# Patient Record
Sex: Male | Born: 1935 | Race: White | Hispanic: No | Smoking: Former smoker
Health system: Southern US, Community
[De-identification: ages and names within clinical notes are randomized; demographics above are authoritative.]

## PROBLEM LIST (undated history)

## (undated) DIAGNOSIS — I10 Essential (primary) hypertension: Secondary | ICD-10-CM

## (undated) DIAGNOSIS — E119 Type 2 diabetes mellitus without complications: Secondary | ICD-10-CM

## (undated) DIAGNOSIS — I2510 Atherosclerotic heart disease of native coronary artery without angina pectoris: Secondary | ICD-10-CM

---

## 1989-06-11 HISTORY — PX: CORONARY ARTERY BYPASS GRAFT: SHX141

## 2000-04-15 ENCOUNTER — Encounter: Payer: Self-pay | Admitting: *Deleted

## 2000-04-15 ENCOUNTER — Inpatient Hospital Stay (HOSPITAL_COMMUNITY): Admission: EM | Admit: 2000-04-15 | Discharge: 2000-04-21 | Payer: Self-pay | Admitting: Podiatry

## 2000-04-25 ENCOUNTER — Encounter: Admission: RE | Admit: 2000-04-25 | Discharge: 2000-07-24 | Payer: Self-pay | Admitting: Cardiology

## 2006-01-03 ENCOUNTER — Inpatient Hospital Stay (HOSPITAL_COMMUNITY): Admission: RE | Admit: 2006-01-03 | Discharge: 2006-01-04 | Payer: Self-pay | Admitting: Cardiology

## 2006-11-25 ENCOUNTER — Ambulatory Visit: Payer: Self-pay | Admitting: Internal Medicine

## 2007-10-25 DIAGNOSIS — I10 Essential (primary) hypertension: Secondary | ICD-10-CM | POA: Insufficient documentation

## 2007-10-25 DIAGNOSIS — E785 Hyperlipidemia, unspecified: Secondary | ICD-10-CM

## 2007-10-25 DIAGNOSIS — I2510 Atherosclerotic heart disease of native coronary artery without angina pectoris: Secondary | ICD-10-CM

## 2007-10-25 DIAGNOSIS — I219 Acute myocardial infarction, unspecified: Secondary | ICD-10-CM | POA: Insufficient documentation

## 2007-10-25 DIAGNOSIS — E1159 Type 2 diabetes mellitus with other circulatory complications: Secondary | ICD-10-CM

## 2009-12-13 ENCOUNTER — Inpatient Hospital Stay (HOSPITAL_COMMUNITY): Admission: RE | Admit: 2009-12-13 | Discharge: 2009-12-17 | Payer: Self-pay | Admitting: Cardiovascular Disease

## 2010-08-27 LAB — BASIC METABOLIC PANEL
BUN: 13 mg/dL (ref 6–23)
BUN: 13 mg/dL (ref 6–23)
BUN: 9 mg/dL (ref 6–23)
CO2: 27 mEq/L (ref 19–32)
Calcium: 8.7 mg/dL (ref 8.4–10.5)
Chloride: 100 mEq/L (ref 96–112)
Chloride: 107 mEq/L (ref 96–112)
Creatinine, Ser: 1.46 mg/dL (ref 0.4–1.5)
GFR calc Af Amer: 57 mL/min — ABNORMAL LOW (ref >60)
GFR calc non Af Amer: 47 mL/min — ABNORMAL LOW (ref >60)
GFR calc non Af Amer: 47 mL/min — ABNORMAL LOW (ref >60)
GFR calc non Af Amer: 57 mL/min — ABNORMAL LOW (ref >60)
Glucose, Bld: 175 mg/dL — ABNORMAL HIGH (ref 70–99)
Glucose, Bld: 213 mg/dL — ABNORMAL HIGH (ref 70–99)
Glucose, Bld: 328 mg/dL — ABNORMAL HIGH (ref 70–99)
Potassium: 4.4 mEq/L (ref 3.5–5.1)
Potassium: 4.6 mEq/L (ref 3.5–5.1)
Sodium: 134 mEq/L — ABNORMAL LOW (ref 135–145)

## 2010-08-27 LAB — LIPID PANEL
LDL Cholesterol: 38 mg/dL (ref 0–99)
VLDL: 35 mg/dL (ref 0–40)

## 2010-08-27 LAB — CBC
HCT: 30.7 % — ABNORMAL LOW (ref 39.0–52.0)
HCT: 34.5 % — ABNORMAL LOW (ref 39.0–52.0)
Hemoglobin: 10.6 g/dL — ABNORMAL LOW (ref 13.0–17.0)
Hemoglobin: 11.6 g/dL — ABNORMAL LOW (ref 13.0–17.0)
Hemoglobin: 9.6 g/dL — ABNORMAL LOW (ref 13.0–17.0)
MCHC: 34.1 g/dL (ref 30.0–36.0)
MCHC: 34.2 g/dL (ref 30.0–36.0)
MCHC: 35 g/dL (ref 30.0–36.0)
MCHC: 35.3 g/dL (ref 30.0–36.0)
MCV: 99.8 fL (ref 78.0–100.0)
Platelets: 98 10*3/uL — ABNORMAL LOW (ref 150–400)
Platelets: 99 10*3/uL — ABNORMAL LOW (ref 150–400)
RBC: 2.8 MIL/uL — ABNORMAL LOW (ref 4.22–5.81)
RBC: 3.06 MIL/uL — ABNORMAL LOW (ref 4.22–5.81)
RBC: 3.4 MIL/uL — ABNORMAL LOW (ref 4.22–5.81)
RDW: 12.7 % (ref 11.5–15.5)
RDW: 12.7 % (ref 11.5–15.5)
WBC: 5.1 10*3/uL (ref 4.0–10.5)
WBC: 5.8 10*3/uL (ref 4.0–10.5)

## 2010-08-27 LAB — GLUCOSE, CAPILLARY
Glucose-Capillary: 148 mg/dL — ABNORMAL HIGH (ref 70–99)
Glucose-Capillary: 183 mg/dL — ABNORMAL HIGH (ref 70–99)
Glucose-Capillary: 195 mg/dL — ABNORMAL HIGH (ref 70–99)
Glucose-Capillary: 197 mg/dL — ABNORMAL HIGH (ref 70–99)
Glucose-Capillary: 210 mg/dL — ABNORMAL HIGH (ref 70–99)
Glucose-Capillary: 211 mg/dL — ABNORMAL HIGH (ref 70–99)
Glucose-Capillary: 216 mg/dL — ABNORMAL HIGH (ref 70–99)
Glucose-Capillary: 246 mg/dL — ABNORMAL HIGH (ref 70–99)
Glucose-Capillary: 253 mg/dL — ABNORMAL HIGH (ref 70–99)
Glucose-Capillary: 324 mg/dL — ABNORMAL HIGH (ref 70–99)
Glucose-Capillary: 326 mg/dL — ABNORMAL HIGH (ref 70–99)

## 2010-08-27 LAB — MRSA PCR SCREENING: MRSA by PCR: NEGATIVE

## 2010-08-27 LAB — CARDIAC PANEL(CRET KIN+CKTOT+MB+TROPI): CK, MB: 2.2 ng/mL (ref 0.3–4.0)

## 2010-08-27 LAB — HEMOGLOBIN A1C
Hgb A1c MFr Bld: 7.6 % — ABNORMAL HIGH (ref <5.7)
Mean Plasma Glucose: 171 mg/dL — ABNORMAL HIGH (ref <117)

## 2010-08-27 LAB — TROPONIN I: Troponin I: 0.16 ng/mL — ABNORMAL HIGH (ref 0.00–0.06)

## 2010-10-24 NOTE — Assessment & Plan Note (Signed)
[facility]                         GASTROENTEROLOGY OFFICE NOTE   NAME:[name], [name]                         MRN:          [id number]  DATE:11/25/2006                            DOB:          Dec 27, 1934    REASON FOR CONSULTATION:  Screening colonoscopy in a patient on Plavix.   HISTORY OF PRESENT ILLNESS:  This is a 76 year old, white male with a  history of hypertension, diabetes mellitus, hyperlipidemia and coronary  artery disease, status post myocardial infarction.  He has also had  remote bypass surgery and has required angioplasty, PTC and multiple  coronary artery stent placements.  He has been on Plavix long-term post  stent placement.  From a cardiac standpoint, he has been stable.  It was  recently recommended by Dr. Clelia Croft that he undergo screening colonoscopy.  The patient had screening sigmoidoscopy years ago and this was  apparently unremarkable.  Currently, he denies any GI symptoms with no  nausea, vomiting, abdominal pain, change in bowel habits, melena or  hematochezia.  His appetite and weight are stable.   PAST MEDICAL HISTORY:  As above.   ALLERGIES:  No known drug allergies.   CURRENT MEDICATIONS:  1. Plavix 75 mg daily.  2. Norvasc 5 mg daily.  3. Isosorbide 30 mg daily.  4. Glipizide 5 mg daily.  5. Toprol XL 50 mg b.i.d.  6. Ramipril 5 mg b.i.d.  7. Lipitor 20 mg at night.  8. Aspirin 325 mg daily.  9. Tricor 145 mg at night.  10.Fish oil 1000 mg b.i.d.  11.Multivitamin daily.   FAMILY HISTORY:  No family history of gastrointestinal malignancy.   SOCIAL HISTORY:  The patient is divorced with three sons.  He lives  alone.  He is retired.  He does not smoke.  He has alcoholic beverage  twice per day.   REVIEW OF SYSTEMS:  Per diagnostic evaluation form.   PHYSICAL EXAMINATION:  GENERAL:  Well-appearing male in no acute  distress.  VITAL SIGNS:  Blood pressure 140/84, heart rate 100 and regular, weight  195  pounds, height 5 feet 8 inches in height.  HEENT:  Sclerae anicteric.  Conjunctivae pink.  Oral mucosa intact.  There is no adenopathy.  LUNGS:  Clear.  ABDOMEN:  Soft without tenderness, mass or hernia.  Good bowel sounds  heard.   IMPRESSION/PLAN:  This is a 76 year old, white male with a history of  coronary artery disease, currently on Plavix for history of coronary  artery stent placement.  He is also diabetic on oral agents.  He is  referred regarding screening colonoscopy.  He is asymptomatic.  He is an  appropriate candidate without contraindication.  However, his issues  with Plavix and diabetes place him at higher than baseline risk.  We  discussed today the pros and cons of continuing or discontinuing Plavix  therapy.  We will contact the patient's cardiologist, Dr. Elsie Lincoln, to see  if it is acceptable to come off of his Plavix for about 5-7 days prior  to the exam.  If this is acceptable, fine.  If not, the patient  understands limitations with regards to polypectomy for large lesions.  With regards to his diabetes, we will hold his oral agent the day of his  exam.     Wilhemina Bonito. Marina Goodell, MD  Electronically Signed    JNP/MedQ  DD: 11/25/2006  DT: 11/26/2006  Job #: [id number]   cc:   Madaline Savage, M.D.  Kari Baars, M.D.

## 2010-10-27 NOTE — Cardiovascular Report (Signed)
NAMEDUGLAS, HEIER NO.:  [id number]   MEDICAL RECORD NO.:  [id number]          PATIENT TYPE:  INP   LOCATION:  2807                         FACILITY:  [facility]   PHYSICIAN:  Madaline Savage, M.D.DATE OF BIRTH:  26-Feb-1935   DATE OF PROCEDURE:  01/03/2006  DATE OF DISCHARGE:                              CARDIAC CATHETERIZATION   PROCEDURES PERFORMED:  1.  Selective right coronary angiography by Judkins technique.  2.  Percutaneous coronary artery stenting of three lesions in the right      coronary artery.      1.  Proximal portion of posterolateral branch of right coronary artery      2.  Intracoronary artery stenting of the distal-most area in the distal          right coronary artery.  3.  Direct coronary artery stenting of a lesser distal right coronary      artery.  Three stents were overlapped which included a 12, a 15 and a 18      mm stent.   COMPLICATIONS:  None.   ENTRY SITE:  Right femoral.   DYE USED:  Omnipaque.   PATIENT PROFILE:  The patient is a delightful 76 year old white married  gentleman who has previously had coronary bypass grafting and has known  graft disease with closure of proximal right coronary artery graft which was  stented some years after his original bypass surgery.   Recently, the patient presented to my office complaining of anginal chest  pain.  A Cardiolite stress test was performed.  It showed RCA territory  reversible ischemia.   The patient subsequently underwent outpatient cardiac catheterization at [facility] and was noted to have patent grafts except for an  occluded graft to the right to the right coronary artery.  The native right  coronary artery was severely disease with to previously placed stents along  the course this vessel (proximal and mid).  There was new disease in the  distal RCA and in the junction between distal RCA and posterolateral branch.  This posterolateral branch was  medium in size in and definitely gave a large  supply of blood to the distal bed.   The patient was given Plavix over the last 7-8 days along with his other  usual medications, and he was brought to the [facility] Catheterization Lab  today on an outpatient basis for elective intracoronary artery stenting  after informed consent.   The case went well in lab #4 at [facility] . The dye load was  approximately 250 mL.  Please see report for actual value.  No complications  occurred.   PROCEDURE:  The procedure was performed via right percutaneous femoral  approach using 1% plain Xylocaine for anesthesia.  I placed a 7-French  arterial sheath in the right femoral artery and a 6-French venous sheath  should it be needed for pacemaker insertion at a later time.  That never  occurred.   The guide catheter used for the successful completion of the procedure was  the second and third guide catheter  used, and it was a Teacher, adult education by [employer], and it was left Amplatz #2 with side holes.  The guide wire  successfully used was a Forensic psychologist, and the three stents used were all Mini  Vision stents by [employer].  The distal most stent was 2.5 x 15.  The middle  stent which overlapped the first was 2.5 x 18, and the third stent  overlapping this middle stent was a 2.5 x 12.  All overlap sites were  balloon dilated as well.  The balloon used for the stents was the only  balloon used to deployed each of them, and we deployed to 15 atmospheres of  pressure which predicted a transluminal diameter of approximately 2.75.   The case was tolerated very well although it was technically difficult.  The  lesions involved all returned to 0% residual and no complications occurred.   FINAL DIAGNOSES:  1.  Recent anginal chest pain with abnormal Cardiolite stress test in a      native right coronary artery which had previously had a right coronary      artery graft fail 100% occluded.  2.  Three-site  distal RCA stenting with three Mini Vision stents overlapping      to create a stented vessel length of approximately 45 mm.   The patient tolerated procedure well and was returned to the holding area.  I did put a 8-French Angio-Seal in his right groin to reduce bleeding  complications.  His ACT at the time of transfer was approximately 300  seconds.           ______________________________  Madaline Savage, M.D.     WHG/MEDQ  D:  01/03/2006  T:  01/03/2006  Job:  [id number]

## 2010-10-27 NOTE — Op Note (Signed)
[place]  Patient:    [name], [name]                         MRN: [id number] Proc. Date: 04/15/00 Adm. Date:  [id number] Attending:  Carmelina Peal CC:         Cardiac Catheterization Lab   Operative Report  PROCEDURES PERFORMED: 1. Two-site percutaneous coronary intervention of the saphenous vein graft to    the left circumflex coronary artery.    a. Intracoronary artery angioplasty and stent deployment into the distal       anastomotic site of the saphenous vein graft.    b. Direct coronary artery stenting into the proximal saphenous vein graft       to the circumflex. 2. Selective coronary angiography by Judkins technique. 3. Left ventricular angiography. 4. Retrograde left heart catheterization.  COMPLICATIONS:  None.  ENTRY SITE:  Right femoral artery.  DYE USED:  Omnipaque.  PATIENT PROFILE:  The patient is a 76 year old gentleman who underwent coronary artery bypass grafting in 1991 by Dr. Andrey Campanile with a saphenous vein graft to the diagonal #1 and diagonal #2, a saphenous vein graft to the obtuse marginal, and a saphenous vein graft to the posterior descending and right coronary artery.  The patient underwent angioplasty of his right coronary artery graft in November 1997 and has been lost to follow-up since then. Today, he developed chest pain at home causing him to present to the emergency room and when evaluated in the emergency room is pain-free.  His enzymes were negative, and his EKGs were normal.  While in the emergency room, he developed bradycardia, a marked rise in blood pressure, severe chest pain graded 7/10, and diaphoresis and was brought to the catheterization lab emergently.  EKGs remained normal.  This procedure was performed with this outpatient presenting to the emergency room on an emergency basis.  RESULTS:  Pressures:  The left ventricular pressure was 190/30.  Central aortic pressure 190/100 with a mean of  135.  Angiographic results: 1. The left main coronary artery was patent. 2. The left anterior descending coronary artery was occluded 100% proximally. 3. The left circumflex coronary artery was occluded 100% proximally. 4. The right coronary artery contained lesions throughout the vessel including    an 80% proximal lesion, a 60% mid, and a 90% distal. 5. The saphenous vein graft to the diagonal branch of the LAD was patent. 6. The left internal mammary artery graft to the mid LAD was patent. 7. The saphenous vein graft to the right coronary artery was diffusely    diseased and was actually widest at the distal portion and narrowest at the    proximal anastomotic site.  The proximal anastomotic site contained an 80%    luminal stenosis that was segmental.  There were some luminal    irregularities in the mid portion of the graft that were approximately    40-50%.  There was a distal anastomotic site stenosis of approximately 90%.  The culprit vessel was the saphenous vein graft to the circumflex obtuse marginal branches #1 and #2.  There was a 70% stenosis in the proximal portion of this large vein graft and a 100% occlusion of the distal vein graft with occlusive thrombus.  Left ventricular angiography showed an ejection fraction of 55% with mild inferobasal hypokinesis.  The interventional procedure performed was as follows:  The guide catheter used was a 7-French left coronary bypass guide.  The guide wire was a Radiographer, therapeutic.  The guide wire crossed the 100% occluded stenosis in the distal circumflex vein graft, and the distal portion of the vessel beyond the occlusion was relatively small and short.  A 3.0 Maverick balloon, 20-mm long, was then advanced across the stenotic area, and an eccentric stenosis gave way and opened nicely.  I then stented the vessel with a 3.0 x 23-mm NIR Elite stent, maximal inflation pressure of 14 atmospheres corresponding to an estimated luminal  diameter of 3.35 mm.  The ostial portion of this vein graft was 70% stenosed and was direct-stented using the same guide catheter and guide wire and a 4.0 x 9-mm NIR Elite stent inflated to 14 atmospheres of pressure corresponding to a luminal diameter of 4.4.  That lesion reduced from 70% to 10%.  No complications occurred.  The patient tolerated the procedure very well.  FINAL IMPRESSION: 1. Multi-vessel native coronary artery disease status post bypass grafting. 2. Acute thrombotic occlusion of the distal vein graft to the circumflex and    70% stenosis of the proximal vein graft to the circumflex with successful    stenting of both lesions with 10% residual stenoses at both sites. 3. Ejection fraction of 55% in the left ventricle without mitral    regurgitation.  Mild hypokinesis.DD:  04/15/00 TD:  04/15/00 Job: [id number]

## 2010-10-27 NOTE — Cardiovascular Report (Signed)
[place]  Patient:    [name], [name]                         MRN: [id number] Proc. Date: 04/19/00 Adm. Date:  [id number] Attending:  Ophelia Shoulder                        Cardiac Catheterization  PROCEDURE PERFORMED:  Two-site percutaneous coronary artery intervention on a right coronary artery saphenous vein graft; ostial stenting of the proximal anastomotic site of the vessel; percutaneous balloon angioplasty of the distal anastomotic site of the saphenous vein graft.  COMPLICATIONS:  None.  ENTRY SITE:  Right femoral.  DYE USED:  Hexabrix 275 cc.  MEDICATIONS GIVEN:  Versed 1 mg and weight-based heparin and weight-based ReoPro.  COMPLICATIONS:  None.  PATIENT PROFILE:  The patient is a 76 year old gentleman, who earlier this week had an acute myocardial infarction related to acute thrombus and a distal saphenous vein graft to the left circumflex.  He underwent two-site coronary artery stenting and had no complications from his myocardial infarction once his stent was placed.  He enters the catheterization lab today because his 76 year old vein graft to the right coronary artery, which was intervened upon in 1997 has showed re-stenosis at the proximal anastomotic site with a 75% stenosis there and a 90% distal anastomotic site stenosis.  The current procedure was performed on an inpatient basis selectively.  DESCRIPTION OF PROCEDURE:  This was performed by a right percutaneous femoral approach using a 7 French introducer sheath.  The guide catheter utilized was a 7 French right Amplatz #1 guide catheter with side holes.  The guidewire used was a Radiographer, therapeutic.  The balloon used to dilate the distal anastomotic site stenosis of 90% was a 2.5 x 10 mm CrossSail balloon.  After multiple inflations the lesion was reduced from 90% to 20%.  This was with peak inflation pressures to 10 atmospheres.  The proximal anastomotic site  of the vein graft was dilated directly with a stent, which was a 3.0 x 18 mm NIR Elite Gold.  The peak inflation pressure was 13 atmospheres corresponding to a expected transluminal diameter of 3.32. There was a little bit of a dimpling just beyond the distal end of that stent so a Quantum Monorail balloon was taken to 6 atmospheres for 60 seconds and the dimpling went away.  The case was successful with complete eradication of any lesion in proximal anastomotic site of the vein graft and marked improvement of the distal anastomotic site.  FINAL DIAGNOSES: 1. Proximal anastomotic site stenting of the right coronary artery graft with    reduction of a 75% lesion to 0%. 2. Successful percutaneous angioplasty of the distal anastomotic site of the    saphenous vein graft with a 90% lesion reduced to 20%. DD:  04/19/00 TD:  04/20/00 Job: [id number]

## 2010-10-27 NOTE — Discharge Summary (Signed)
NAME:  [name], [name] NO.:  [id number]   MEDICAL RECORD NO.:  [id number]          PATIENT TYPE:  INP   LOCATION:  2924                         FACILITY:  [facility]   PHYSICIAN:  Madaline Savage, M.D.DATE OF BIRTH:  [birth year]   DATE OF ADMISSION:  01/03/2006  DATE OF DISCHARGE:  01/04/2006                                 DISCHARGE SUMMARY   HISTORY:  [name] is a 76 year old male patient of Dr. Chanda Busing who came into the hospital electively for an intervention to his RCA.  He had previously undergone a Cardiolite test that showed positive ischemia.  He went on to have a heart catheter at [facility].  He had an  SVG to his RCA that was 100% close, which was old.  He had an SVG to his  diagonal one too, which was patent with a patent stent.  His LIMA to his LAD  was patent.  His SVG to his left circumflex was also patent.  He did have a  native vessel disease with 100% LAD, 100% left main, and he had RCA native  vessel disease with 50% proximal, 60% mid, and 75% mid, and a 95% distal at  the AB groove before his POA and PDA.  It was decided that he should undergo  PTCA and stenting.  On January 03, 2006, he had three mini VISION stents  placed, which were overlapping.  They were 2.5 x 15 mm, 2.5 x 18 mm, and 2.5  x 12 mm.  Lesions were reduced down to 0.  The following morning, on January 04, 2006, he was seen by Dr. Susa Griffins, considered stable for  discharge.  Dr. Alanda Amass did review the cine films and noted that he had  some residual right ostial stenosis that will be followed and reviewed by  Dr. Elsie Lincoln.  His blood pressure, on the day of discharge, was 142/88, heart  rate was 88, respirations 18, temperature 99.2, on room air stats were 95%.  His labs showed a sodium of 135, potassium 4.1, BUN 12, creatine 1.4, and  glucose 188, chloride 102, CO2 27.  Hemoglobin 12.9, hematocrit 36.9, WBC  4.7, glucose 162.  His hemoglobin A1c was  7.5, his CK-MB was 37/1.1.  He  apparently has been told before the he has had diabetes and it was diet and  exercise controlled.  He as receiving insulin for his blood sugars on a  sliding-scale basis.  This was discussed with the patient prior to discharge  and he agrees now to take a diabetic agent.  Chest x-ray showed no acute  disease.   DISCHARGE MEDICATIONS:  Aspirin 81 mg 1 time per day, Plavix 75 mg 1 time  per day, metoprolol 50 mg 2 times per day, Altace 5 mg 2 times per day,  __________  30 mg 1 time per day, amlodipine5 mg 1 time per day, Lipitor 20  mg 1 time per day, and glipizide 5 mg 1 time per day in the morning at  breakfast.  He was told that he needs to call  for the primary care doctor,  he does not have one now.  The patient states that he is not sure if he will  see one or not.  [facility] for [facility], Dr. Clelia Croft, was  given a number for Dr. __________ .  He will return to see Dr. Elsie Lincoln on  April 6, at 4:15.  He was asked to check his blood sugars before breakfast,  lunch, and dinner and at bed time and to record them for 1 week and then he  can decrease it down to twice a day.  He should take these to a doctor that  he chooses to see to follow his diabetes.  He was told that he needs to be  seen approximately every 3 months for his diabetes.   DISCHARGE DIAGNOSES:  1.  Coronary artery disease, history of coronary artery bypass grafting with      progressive disease.  Since then he has had percutaneous coronary      intervention and stenting.  He has had stents placed to his saphenous      vein graft to his diagonal, and saphenous vein graft to his left      circumflex in the past.  He has also had a percutaneous transluminal      angioplasty to his right coronary artery in the past.  This admission,      he underwent overlapping stents to his native right coronary artery as      described above.  2.  Residual coronary artery disease.  He has a  50% in his circumflex past      his graft.  He has ostial right coronary artery stenosis, proximal and      mid right coronary artery stenosis as described above.  3.  Normal ejection fraction.  4.  New diabetes mellitus with a hemoglobin A1c of 7.5.  It is recommended      that he start diabetic medications.  We were going to send him home on      Glucophage b.i.d., however his initial creatinine, as an outpatient, was      1.6.  Here in the hospital is has been 1.4.  It was decided to start him      on glipizide.  His blood sugar, on the day of discharge, was 333.  He      was being covered by sliding-scale insulin.  He would not be able to      start his Glucophage for 2 days post cardiac catheter.  5.  Hyperlipidemia.  6.  Hypertension.      Lezlie Octave, N.P.    ______________________________  Madaline Savage, M.D.    BB/MEDQ  D:  01/04/2006  T:  01/05/2006  Job:  [id number]

## 2010-10-27 NOTE — Discharge Summary (Signed)
[employer] [facility]  Patient:    [name], [name]                         MRN: [id number] Adm. Date:  [id number] Disc. Date: [id number] Attending:  Ophelia Shoulder Dictator:   [facility] [place], [place].A.                           Discharge Summary  ADMISSION DIAGNOSES: 1. Unstable angina. 2. History of coronary artery disease, with a history of coronary artery    bypass grafting. 3. Hypertension. 4. Hyperlipidemia. 5. Non-insulin-dependent diabetes mellitus.  DISCHARGE DIAGNOSES: 1. Unstable angina. 2. History of coronary artery disease, with a history of coronary artery    bypass grafting. 3. Hypertension. 4. Hyperlipidemia. 5. Non-insulin-dependent diabetes mellitus. 6. Recurrent chest pain, again later on April 15, 2000. 7. Status post emergent cardiac catheterization on April 15, 2000, by    Dr. Lavonne Chick with percutaneous transluminal coronary angioplasty stent    to the distal circumflex graft and stent to the proximal left circumflex    graft. 8. Status post percutaneous transluminal coronary angioplasty and stenting to    the ostial right coronary artery saphenous vein graft on April 19, 2000,    by Dr. Lavonne Chick.  HISTORY OF PRESENT ILLNESS:  [name] is a 76 year old divorced white male with a history of coronary artery disease, status post coronary artery bypass grafting approximately 10 years ago.  As well, he has a history of hypertension and hyperlipidemia.  Earlier that day he had walked his dog at approximately 6 a.m. out in the yard.  He then came in, at which time he developed substernal chest pain.  It was a 4/10 at its worse.  It waxed and waned.  It "feels like indigestion."  However, it was also very similar to his angioplasty pain in 1997.  That morning he had taken an aspirin.  He had no nitroglycerin at home.  Duration of chest pain was two hours.  There were no associated symptoms.  No nausea, vomiting,  diaphoresis, shortness of breath, palpitations, presyncope, or syncope.  On exam at that time his blood pressure was 160/84, heart rate 74.  His exam was essentially benign.  However, rectal exam did reveal heme-positive stool. EKG revealed normal sinus rhythm with no acute abnormality.  Of note, hemoglobin was stable at 15.6.  The rest of his CBC and electrolytes were normal, and his first CK was 128 at that time.  At that time of interview in the ER, he was currently pain-free.  His symptoms were exactly like those prior to his PCI in 1997.  He was planned for admission to rule out myocardial infarction.  We will check serial enzymes.  At that time we planned to hold on anticoagulation secondary to heme stool.  He was scheduled for cardiac catheterization the following morning.  He was placed on IV nitroglycerin for blood pressure control.  He was currently pain-free at that point.  [name] subsequently redeveloped recurrent chest pain while still in the emergency room later that day.  As well, he subsequently developed some mild changes on his EKG.  Therefore, he was planned for emergent cardiac catheterization that day by Dr. Lavonne Chick.  HOSPITAL COURSE:  On April 15, 2000, [name] underwent emergent cardiac catheterization by Dr. Lavonne Chick.  Dr. Elsie Lincoln performed the following:  He performed two-site coronary intervention  of the saphenous vein graft to the left circumflex coronary artery.  He performed intracoronary artery angioplasty and stent deployment to the distal anastomotic site of the saphenous vein graft.  He also performed a right coronary artery stenting into the proximal saphenous vein graft to the circumflex.  On April 16, 2000, his cardiac enzymes were positive.  However, EKG showed no acute change that morning.  It was felt that he would probably need intervention to the RCA graft eventually as well.  On April 17, 2000, [name] remained stable and was  weaned off his IV nitroglycerin.  He was planned for intervention to the RCA graft on the upcoming Friday.  He remained stable over the next several days.  We were awaiting for him to become further stable prior to further intervention.  On April 19, 2000, he underwent repeat cardiac catheterization by Dr. Lavonne Chick.  At this time he had undergone two-site percutaneous coronary artery intervention on the right coronary artery saphenous vein graft.  There was also stenting of the proximal anastomotic site of the vessel, percutaneous balloon angioplasty of the distal anastomotic site of the saphenous vein graft.  He used IV heparin and ReoPro for the procedure.  The proximal region went from 75% to 0% residual.  The distal site went from 90% to 20% residual.  Over the next couple of days, [name] remained stable, without chest pain. He ambulated with cardiac rehabilitation and gained strength and had no further complications.  On April 21, 2000, [name] was deemed stable for discharge home.  He was afebrile at 97.8, he had a pulse of 78, blood pressure 125/65, oxygen saturation 94% on room air.  He had been in normal sinus rhythm with no dysrhythmias.  His right groin was stable postcatheterization.  He was felt to be on appropriate medications and felt to be stable for discharge home.  CONSULTATIONS:  None.  PROCEDURES: 1. Cardiac catheterization by Dr. Lavonne Chick on April 15, 2000.  This    revealed the following findings:    a. Left main coronary artery patent.    b. Left anterior descending coronary artery occluded 100% proximally.    c. Left circumflex ______ contained lesions throughout the vessel       including 80% proximal lesion, 60% mid, and 90% distal.    d. Saphenous vein graft to the diagonal branch of the LAD patent.    e. LIMA to the mid LAD patent.    f. Saphenous vein graft to the right coronary artery diffusely diseased and       actually widest at the  distal portion and narrowest at the diseased       proximal anastomotic site.  The proximal anastomotic site contained an        80% normal stenosis that was segmental.  There were some luminal       irregularities in the mid portion of the graft that were approximately       40-50%.  There was distal anastomotic site stenosis of approximately       90%. 2. Left ventriculography showed ejection fraction 55% with mild inferobasal    hypokinesis. 3. Dr. Elsie Lincoln then proceeded with two-site percutaneous coronary intervention    to the saphenous vein graft to the left circumflex.  He performed    intracoronary artery angioplasty and stent deployment into the distal    anastomotic site of the saphenous vein graft.  He also performed a right  coronary artery stenting into the proximal saphenous vein graft to the    circumflex. 4. Cardiac catheterization again on April 19, 2000, by Dr. Lavonne Chick.  At    this catheterization, he performed two-site percutaneous coronary artery    intervention of the right coronary artery saphenous vein graft.  Ostial    stenting of the proximal anastomotic site of the vessel.  Percutaneous    balloon angioplasty of the distal anastomotic site of the saphenous vein    graft.  Final diagnosis had proximal anastomotic site stenting of RCA    grafts, with reduction of 75% lesion to 0%.  He also performed successful    percutaneous angioplasty of the distal anastomotic site to the saphenous    vein graft with a 90% lesion reduced to 20%.  LABORATORY DATA:  Cardiac enzymes revealed CKs of 128, 88, 403, 586, 624, 451, and 70.  CK-MBs were 3.0, 4.4, 82.4, 117.1, 114.4, 69.8, 3.4.  Troponins were 0.5, 0.23, 6.55, 6.77.  Lipid profile reveals cholesterol 225, triglycerides 133, HDL 37, LDL 161.  On April 15, 2000, CBC shows WBC 5.8, hemoglobin 15.6, hematocrit 43.4, platelets 151.  On April 20, 2000, WBC 8.1, hemoglobin 11.3, hematocrit 32.5, platelets 162.   Metabolic profile on April 15, 2000, shows sodium 139, potassium 3.9, glucose 187, BUN 18, creatinine 1.5.  On April 20, 2000, sodium 136, potassium 3.7, glucose 174, BUN 22, creatinine 1.5.  LFTs on April 19, 2000, shows AST 36, ALT 24, ALP 40, total bilirubin 0.8.  On April 15, 2000, PT 12.5, INR 1.0, PTT 24.  Radiology:  Chest x-ray on April 15, 2000, shows no active disease.  Initial EKG in the emergency room showed normal sinus rhythm with no significant ST-T changes.  DISCHARGE MEDICATIONS: 1. Enteric-coated aspirin 325 mg once a day. 2. Plavix 75 mg once a day for one month. 3. Protonix 40 mg once a day. 4. Altace 5 mg 1 twice a day. 5. Lopressor 50 mg 1 twice a day. 6. Norvasc 5 mg once a day. 7. Zocor 40 mg each night. 8. Imdur 30 mg once a day. 9. Nitroglycerin 0.4 mg sublingual as directed.  ACTIVITY:  No strenuous activity, lifting greater than five pounds, driving, or sexual activity for three days.  DIET:  Low salt, low fat, low cholesterol, diabetic diet.  WOUND CARE:  May gently wash the groin with warm water and soap.  DISCHARGE INSTRUCTIONS:  Call the office at [phone] if any bleeding or increased size or pain of the groin.  FOLLOW-UP:  On Monday, call the office at [phone] to make an appointment to see Dr. Elsie Lincoln in two to three weeks. DD:  04/22/00 TD:  04/22/00 Job: [id number]

## 2011-07-02 DIAGNOSIS — E119 Type 2 diabetes mellitus without complications: Secondary | ICD-10-CM | POA: Diagnosis not present

## 2011-08-09 DIAGNOSIS — I10 Essential (primary) hypertension: Secondary | ICD-10-CM | POA: Diagnosis not present

## 2011-08-09 DIAGNOSIS — E1129 Type 2 diabetes mellitus with other diabetic kidney complication: Secondary | ICD-10-CM | POA: Diagnosis not present

## 2011-08-09 DIAGNOSIS — E785 Hyperlipidemia, unspecified: Secondary | ICD-10-CM | POA: Diagnosis not present

## 2011-08-09 DIAGNOSIS — I259 Chronic ischemic heart disease, unspecified: Secondary | ICD-10-CM | POA: Diagnosis not present

## 2011-12-06 DIAGNOSIS — I10 Essential (primary) hypertension: Secondary | ICD-10-CM | POA: Diagnosis not present

## 2011-12-06 DIAGNOSIS — E1129 Type 2 diabetes mellitus with other diabetic kidney complication: Secondary | ICD-10-CM | POA: Diagnosis not present

## 2011-12-06 DIAGNOSIS — I259 Chronic ischemic heart disease, unspecified: Secondary | ICD-10-CM | POA: Diagnosis not present

## 2012-02-12 DIAGNOSIS — Z23 Encounter for immunization: Secondary | ICD-10-CM | POA: Diagnosis not present

## 2012-04-04 DIAGNOSIS — I2510 Atherosclerotic heart disease of native coronary artery without angina pectoris: Secondary | ICD-10-CM | POA: Diagnosis not present

## 2012-04-04 DIAGNOSIS — Z125 Encounter for screening for malignant neoplasm of prostate: Secondary | ICD-10-CM | POA: Diagnosis not present

## 2012-04-04 DIAGNOSIS — E1129 Type 2 diabetes mellitus with other diabetic kidney complication: Secondary | ICD-10-CM | POA: Diagnosis not present

## 2012-04-04 DIAGNOSIS — I10 Essential (primary) hypertension: Secondary | ICD-10-CM | POA: Diagnosis not present

## 2012-04-04 DIAGNOSIS — E785 Hyperlipidemia, unspecified: Secondary | ICD-10-CM | POA: Diagnosis not present

## 2012-04-11 DIAGNOSIS — Z1212 Encounter for screening for malignant neoplasm of rectum: Secondary | ICD-10-CM | POA: Diagnosis not present

## 2012-04-11 DIAGNOSIS — Z0000 Encounter for general adult medical examination without abnormal findings: Secondary | ICD-10-CM | POA: Diagnosis not present

## 2012-04-11 DIAGNOSIS — I259 Chronic ischemic heart disease, unspecified: Secondary | ICD-10-CM | POA: Diagnosis not present

## 2012-04-11 DIAGNOSIS — Z87891 Personal history of nicotine dependence: Secondary | ICD-10-CM | POA: Diagnosis not present

## 2012-04-11 DIAGNOSIS — Z125 Encounter for screening for malignant neoplasm of prostate: Secondary | ICD-10-CM | POA: Diagnosis not present

## 2012-04-11 DIAGNOSIS — I10 Essential (primary) hypertension: Secondary | ICD-10-CM | POA: Diagnosis not present

## 2012-04-11 DIAGNOSIS — E1129 Type 2 diabetes mellitus with other diabetic kidney complication: Secondary | ICD-10-CM | POA: Diagnosis not present

## 2012-06-30 DIAGNOSIS — H5200 Hypermetropia, unspecified eye: Secondary | ICD-10-CM | POA: Diagnosis not present

## 2012-06-30 DIAGNOSIS — H2589 Other age-related cataract: Secondary | ICD-10-CM | POA: Diagnosis not present

## 2012-06-30 DIAGNOSIS — E119 Type 2 diabetes mellitus without complications: Secondary | ICD-10-CM | POA: Diagnosis not present

## 2012-10-06 DIAGNOSIS — E785 Hyperlipidemia, unspecified: Secondary | ICD-10-CM | POA: Diagnosis not present

## 2012-10-06 DIAGNOSIS — I259 Chronic ischemic heart disease, unspecified: Secondary | ICD-10-CM | POA: Diagnosis not present

## 2012-10-06 DIAGNOSIS — IMO0002 Reserved for concepts with insufficient information to code with codable children: Secondary | ICD-10-CM | POA: Diagnosis not present

## 2012-10-06 DIAGNOSIS — I10 Essential (primary) hypertension: Secondary | ICD-10-CM | POA: Diagnosis not present

## 2012-10-06 DIAGNOSIS — Z1331 Encounter for screening for depression: Secondary | ICD-10-CM | POA: Diagnosis not present

## 2012-10-06 DIAGNOSIS — E1129 Type 2 diabetes mellitus with other diabetic kidney complication: Secondary | ICD-10-CM | POA: Diagnosis not present

## 2012-10-06 DIAGNOSIS — Z951 Presence of aortocoronary bypass graft: Secondary | ICD-10-CM | POA: Diagnosis not present

## 2013-01-29 DIAGNOSIS — Z23 Encounter for immunization: Secondary | ICD-10-CM | POA: Diagnosis not present

## 2013-04-15 DIAGNOSIS — Z125 Encounter for screening for malignant neoplasm of prostate: Secondary | ICD-10-CM | POA: Diagnosis not present

## 2013-04-15 DIAGNOSIS — E785 Hyperlipidemia, unspecified: Secondary | ICD-10-CM | POA: Diagnosis not present

## 2013-04-15 DIAGNOSIS — I10 Essential (primary) hypertension: Secondary | ICD-10-CM | POA: Diagnosis not present

## 2013-04-15 DIAGNOSIS — Z0000 Encounter for general adult medical examination without abnormal findings: Secondary | ICD-10-CM | POA: Diagnosis not present

## 2013-04-15 DIAGNOSIS — E1129 Type 2 diabetes mellitus with other diabetic kidney complication: Secondary | ICD-10-CM | POA: Diagnosis not present

## 2013-04-16 DIAGNOSIS — I10 Essential (primary) hypertension: Secondary | ICD-10-CM | POA: Diagnosis not present

## 2013-04-16 DIAGNOSIS — I259 Chronic ischemic heart disease, unspecified: Secondary | ICD-10-CM | POA: Diagnosis not present

## 2013-04-16 DIAGNOSIS — R972 Elevated prostate specific antigen [PSA]: Secondary | ICD-10-CM | POA: Diagnosis not present

## 2013-04-16 DIAGNOSIS — Z0000 Encounter for general adult medical examination without abnormal findings: Secondary | ICD-10-CM | POA: Diagnosis not present

## 2013-04-16 DIAGNOSIS — E785 Hyperlipidemia, unspecified: Secondary | ICD-10-CM | POA: Diagnosis not present

## 2013-04-16 DIAGNOSIS — Z125 Encounter for screening for malignant neoplasm of prostate: Secondary | ICD-10-CM | POA: Diagnosis not present

## 2013-04-16 DIAGNOSIS — E1129 Type 2 diabetes mellitus with other diabetic kidney complication: Secondary | ICD-10-CM | POA: Diagnosis not present

## 2013-04-16 DIAGNOSIS — Z1212 Encounter for screening for malignant neoplasm of rectum: Secondary | ICD-10-CM | POA: Diagnosis not present

## 2013-04-16 DIAGNOSIS — Z87891 Personal history of nicotine dependence: Secondary | ICD-10-CM | POA: Diagnosis not present

## 2013-05-12 ENCOUNTER — Telehealth (HOSPITAL_COMMUNITY): Payer: Self-pay | Admitting: *Deleted

## 2013-05-14 DIAGNOSIS — R972 Elevated prostate specific antigen [PSA]: Secondary | ICD-10-CM | POA: Diagnosis not present

## 2013-05-25 ENCOUNTER — Telehealth (HOSPITAL_COMMUNITY): Payer: Self-pay | Admitting: *Deleted

## 2013-10-14 DIAGNOSIS — N183 Chronic kidney disease, stage 3 unspecified: Secondary | ICD-10-CM | POA: Diagnosis not present

## 2013-10-14 DIAGNOSIS — E785 Hyperlipidemia, unspecified: Secondary | ICD-10-CM | POA: Diagnosis not present

## 2013-10-14 DIAGNOSIS — E1129 Type 2 diabetes mellitus with other diabetic kidney complication: Secondary | ICD-10-CM | POA: Diagnosis not present

## 2013-10-14 DIAGNOSIS — Z951 Presence of aortocoronary bypass graft: Secondary | ICD-10-CM | POA: Diagnosis not present

## 2013-10-14 DIAGNOSIS — Z23 Encounter for immunization: Secondary | ICD-10-CM | POA: Diagnosis not present

## 2013-10-14 DIAGNOSIS — I10 Essential (primary) hypertension: Secondary | ICD-10-CM | POA: Diagnosis not present

## 2013-10-14 DIAGNOSIS — Z6826 Body mass index (BMI) 26.0-26.9, adult: Secondary | ICD-10-CM | POA: Diagnosis not present

## 2013-10-14 DIAGNOSIS — I259 Chronic ischemic heart disease, unspecified: Secondary | ICD-10-CM | POA: Diagnosis not present

## 2013-10-23 ENCOUNTER — Emergency Department
Admission: EM | Admit: 2013-10-23 | Discharge: 2013-10-23 | Discharge status: Absent without leave | Payer: Self-pay | Source: Home / Self Care

## 2013-10-23 DIAGNOSIS — H5200 Hypermetropia, unspecified eye: Secondary | ICD-10-CM | POA: Diagnosis not present

## 2013-10-23 DIAGNOSIS — H2589 Other age-related cataract: Secondary | ICD-10-CM | POA: Diagnosis not present

## 2013-10-23 DIAGNOSIS — E119 Type 2 diabetes mellitus without complications: Secondary | ICD-10-CM | POA: Diagnosis not present

## 2014-02-11 DIAGNOSIS — Z23 Encounter for immunization: Secondary | ICD-10-CM | POA: Diagnosis not present

## 2014-04-21 DIAGNOSIS — E785 Hyperlipidemia, unspecified: Secondary | ICD-10-CM | POA: Diagnosis not present

## 2014-04-21 DIAGNOSIS — Z125 Encounter for screening for malignant neoplasm of prostate: Secondary | ICD-10-CM | POA: Diagnosis not present

## 2014-04-21 DIAGNOSIS — E1129 Type 2 diabetes mellitus with other diabetic kidney complication: Secondary | ICD-10-CM | POA: Diagnosis not present

## 2014-04-21 DIAGNOSIS — Z0000 Encounter for general adult medical examination without abnormal findings: Secondary | ICD-10-CM | POA: Diagnosis not present

## 2014-04-28 DIAGNOSIS — I10 Essential (primary) hypertension: Secondary | ICD-10-CM | POA: Diagnosis not present

## 2014-04-28 DIAGNOSIS — F17201 Nicotine dependence, unspecified, in remission: Secondary | ICD-10-CM | POA: Diagnosis not present

## 2014-04-28 DIAGNOSIS — Z9861 Coronary angioplasty status: Secondary | ICD-10-CM | POA: Diagnosis not present

## 2014-04-28 DIAGNOSIS — Z0000 Encounter for general adult medical examination without abnormal findings: Secondary | ICD-10-CM | POA: Diagnosis not present

## 2014-04-28 DIAGNOSIS — N183 Chronic kidney disease, stage 3 (moderate): Secondary | ICD-10-CM | POA: Diagnosis not present

## 2014-04-28 DIAGNOSIS — E1129 Type 2 diabetes mellitus with other diabetic kidney complication: Secondary | ICD-10-CM | POA: Diagnosis not present

## 2014-04-28 DIAGNOSIS — Z955 Presence of coronary angioplasty implant and graft: Secondary | ICD-10-CM | POA: Diagnosis not present

## 2014-04-28 DIAGNOSIS — Z6827 Body mass index (BMI) 27.0-27.9, adult: Secondary | ICD-10-CM | POA: Diagnosis not present

## 2014-04-28 DIAGNOSIS — E785 Hyperlipidemia, unspecified: Secondary | ICD-10-CM | POA: Diagnosis not present

## 2014-05-07 DIAGNOSIS — Z1212 Encounter for screening for malignant neoplasm of rectum: Secondary | ICD-10-CM | POA: Diagnosis not present

## 2014-10-25 DIAGNOSIS — Z9861 Coronary angioplasty status: Secondary | ICD-10-CM | POA: Diagnosis not present

## 2014-10-25 DIAGNOSIS — Z955 Presence of coronary angioplasty implant and graft: Secondary | ICD-10-CM | POA: Diagnosis not present

## 2014-10-25 DIAGNOSIS — Z6827 Body mass index (BMI) 27.0-27.9, adult: Secondary | ICD-10-CM | POA: Diagnosis not present

## 2014-10-25 DIAGNOSIS — I10 Essential (primary) hypertension: Secondary | ICD-10-CM | POA: Diagnosis not present

## 2014-10-25 DIAGNOSIS — E1129 Type 2 diabetes mellitus with other diabetic kidney complication: Secondary | ICD-10-CM | POA: Diagnosis not present

## 2014-10-25 DIAGNOSIS — N183 Chronic kidney disease, stage 3 (moderate): Secondary | ICD-10-CM | POA: Diagnosis not present

## 2014-10-25 DIAGNOSIS — E785 Hyperlipidemia, unspecified: Secondary | ICD-10-CM | POA: Diagnosis not present

## 2014-10-26 DIAGNOSIS — E119 Type 2 diabetes mellitus without complications: Secondary | ICD-10-CM | POA: Diagnosis not present

## 2014-10-26 DIAGNOSIS — H25813 Combined forms of age-related cataract, bilateral: Secondary | ICD-10-CM | POA: Diagnosis not present

## 2014-10-26 DIAGNOSIS — H52229 Regular astigmatism, unspecified eye: Secondary | ICD-10-CM | POA: Diagnosis not present

## 2014-12-28 DIAGNOSIS — H2512 Age-related nuclear cataract, left eye: Secondary | ICD-10-CM | POA: Diagnosis not present

## 2015-01-04 DIAGNOSIS — E785 Hyperlipidemia, unspecified: Secondary | ICD-10-CM | POA: Diagnosis not present

## 2015-01-04 DIAGNOSIS — H25812 Combined forms of age-related cataract, left eye: Secondary | ICD-10-CM | POA: Diagnosis not present

## 2015-01-04 DIAGNOSIS — E119 Type 2 diabetes mellitus without complications: Secondary | ICD-10-CM | POA: Diagnosis not present

## 2015-01-04 DIAGNOSIS — Z79899 Other long term (current) drug therapy: Secondary | ICD-10-CM | POA: Diagnosis not present

## 2015-01-04 DIAGNOSIS — I10 Essential (primary) hypertension: Secondary | ICD-10-CM | POA: Diagnosis not present

## 2015-01-04 DIAGNOSIS — Z87891 Personal history of nicotine dependence: Secondary | ICD-10-CM | POA: Diagnosis not present

## 2015-01-04 DIAGNOSIS — Z951 Presence of aortocoronary bypass graft: Secondary | ICD-10-CM | POA: Diagnosis not present

## 2015-01-04 DIAGNOSIS — Z955 Presence of coronary angioplasty implant and graft: Secondary | ICD-10-CM | POA: Diagnosis not present

## 2015-01-04 DIAGNOSIS — I2510 Atherosclerotic heart disease of native coronary artery without angina pectoris: Secondary | ICD-10-CM | POA: Diagnosis not present

## 2015-01-04 DIAGNOSIS — Z7982 Long term (current) use of aspirin: Secondary | ICD-10-CM | POA: Diagnosis not present

## 2015-01-04 DIAGNOSIS — H259 Unspecified age-related cataract: Secondary | ICD-10-CM | POA: Diagnosis not present

## 2015-01-04 DIAGNOSIS — H2512 Age-related nuclear cataract, left eye: Secondary | ICD-10-CM | POA: Diagnosis not present

## 2015-01-04 DIAGNOSIS — I252 Old myocardial infarction: Secondary | ICD-10-CM | POA: Diagnosis not present

## 2015-02-01 DIAGNOSIS — Z951 Presence of aortocoronary bypass graft: Secondary | ICD-10-CM | POA: Diagnosis not present

## 2015-02-01 DIAGNOSIS — E119 Type 2 diabetes mellitus without complications: Secondary | ICD-10-CM | POA: Diagnosis not present

## 2015-02-01 DIAGNOSIS — H259 Unspecified age-related cataract: Secondary | ICD-10-CM | POA: Diagnosis not present

## 2015-02-01 DIAGNOSIS — H25811 Combined forms of age-related cataract, right eye: Secondary | ICD-10-CM | POA: Diagnosis not present

## 2015-02-01 DIAGNOSIS — I252 Old myocardial infarction: Secondary | ICD-10-CM | POA: Diagnosis not present

## 2015-02-01 DIAGNOSIS — R785 Finding of other psychotropic drug in blood: Secondary | ICD-10-CM | POA: Diagnosis not present

## 2015-02-01 DIAGNOSIS — I10 Essential (primary) hypertension: Secondary | ICD-10-CM | POA: Diagnosis not present

## 2015-02-01 DIAGNOSIS — Z79899 Other long term (current) drug therapy: Secondary | ICD-10-CM | POA: Diagnosis not present

## 2015-02-01 DIAGNOSIS — Z955 Presence of coronary angioplasty implant and graft: Secondary | ICD-10-CM | POA: Diagnosis not present

## 2015-02-01 DIAGNOSIS — I2510 Atherosclerotic heart disease of native coronary artery without angina pectoris: Secondary | ICD-10-CM | POA: Diagnosis not present

## 2015-02-01 DIAGNOSIS — Z7902 Long term (current) use of antithrombotics/antiplatelets: Secondary | ICD-10-CM | POA: Diagnosis not present

## 2015-03-01 DIAGNOSIS — Z23 Encounter for immunization: Secondary | ICD-10-CM | POA: Diagnosis not present

## 2015-04-27 DIAGNOSIS — I10 Essential (primary) hypertension: Secondary | ICD-10-CM | POA: Diagnosis not present

## 2015-04-27 DIAGNOSIS — E1129 Type 2 diabetes mellitus with other diabetic kidney complication: Secondary | ICD-10-CM | POA: Diagnosis not present

## 2015-04-27 DIAGNOSIS — E785 Hyperlipidemia, unspecified: Secondary | ICD-10-CM | POA: Diagnosis not present

## 2015-04-27 DIAGNOSIS — Z125 Encounter for screening for malignant neoplasm of prostate: Secondary | ICD-10-CM | POA: Diagnosis not present

## 2015-05-03 DIAGNOSIS — E1129 Type 2 diabetes mellitus with other diabetic kidney complication: Secondary | ICD-10-CM | POA: Diagnosis not present

## 2015-05-03 DIAGNOSIS — Z9861 Coronary angioplasty status: Secondary | ICD-10-CM | POA: Diagnosis not present

## 2015-05-03 DIAGNOSIS — Z955 Presence of coronary angioplasty implant and graft: Secondary | ICD-10-CM | POA: Diagnosis not present

## 2015-05-03 DIAGNOSIS — Z0000 Encounter for general adult medical examination without abnormal findings: Secondary | ICD-10-CM | POA: Diagnosis not present

## 2015-05-03 DIAGNOSIS — Z1389 Encounter for screening for other disorder: Secondary | ICD-10-CM | POA: Diagnosis not present

## 2015-05-03 DIAGNOSIS — I10 Essential (primary) hypertension: Secondary | ICD-10-CM | POA: Diagnosis not present

## 2015-05-03 DIAGNOSIS — N183 Chronic kidney disease, stage 3 (moderate): Secondary | ICD-10-CM | POA: Diagnosis not present

## 2015-05-03 DIAGNOSIS — E785 Hyperlipidemia, unspecified: Secondary | ICD-10-CM | POA: Diagnosis not present

## 2015-05-04 DIAGNOSIS — Z1212 Encounter for screening for malignant neoplasm of rectum: Secondary | ICD-10-CM | POA: Diagnosis not present

## 2015-05-25 DIAGNOSIS — Z1212 Encounter for screening for malignant neoplasm of rectum: Secondary | ICD-10-CM | POA: Diagnosis not present

## 2015-05-25 DIAGNOSIS — Z1211 Encounter for screening for malignant neoplasm of colon: Secondary | ICD-10-CM | POA: Diagnosis not present

## 2015-10-31 DIAGNOSIS — I10 Essential (primary) hypertension: Secondary | ICD-10-CM | POA: Diagnosis not present

## 2015-10-31 DIAGNOSIS — Z6827 Body mass index (BMI) 27.0-27.9, adult: Secondary | ICD-10-CM | POA: Diagnosis not present

## 2015-10-31 DIAGNOSIS — Z955 Presence of coronary angioplasty implant and graft: Secondary | ICD-10-CM | POA: Diagnosis not present

## 2015-10-31 DIAGNOSIS — E1129 Type 2 diabetes mellitus with other diabetic kidney complication: Secondary | ICD-10-CM | POA: Diagnosis not present

## 2015-10-31 DIAGNOSIS — Z9861 Coronary angioplasty status: Secondary | ICD-10-CM | POA: Diagnosis not present

## 2015-10-31 DIAGNOSIS — N183 Chronic kidney disease, stage 3 (moderate): Secondary | ICD-10-CM | POA: Diagnosis not present

## 2015-10-31 DIAGNOSIS — E784 Other hyperlipidemia: Secondary | ICD-10-CM | POA: Diagnosis not present

## 2016-03-13 DIAGNOSIS — Z23 Encounter for immunization: Secondary | ICD-10-CM | POA: Diagnosis not present

## 2016-03-30 DIAGNOSIS — E119 Type 2 diabetes mellitus without complications: Secondary | ICD-10-CM | POA: Diagnosis not present

## 2016-03-30 DIAGNOSIS — H52223 Regular astigmatism, bilateral: Secondary | ICD-10-CM | POA: Diagnosis not present

## 2016-04-30 DIAGNOSIS — R358 Other polyuria: Secondary | ICD-10-CM | POA: Diagnosis not present

## 2016-04-30 DIAGNOSIS — R8299 Other abnormal findings in urine: Secondary | ICD-10-CM | POA: Diagnosis not present

## 2016-04-30 DIAGNOSIS — E1129 Type 2 diabetes mellitus with other diabetic kidney complication: Secondary | ICD-10-CM | POA: Diagnosis not present

## 2016-04-30 DIAGNOSIS — I10 Essential (primary) hypertension: Secondary | ICD-10-CM | POA: Diagnosis not present

## 2016-04-30 DIAGNOSIS — E784 Other hyperlipidemia: Secondary | ICD-10-CM | POA: Diagnosis not present

## 2016-05-07 DIAGNOSIS — Z1389 Encounter for screening for other disorder: Secondary | ICD-10-CM | POA: Diagnosis not present

## 2016-05-07 DIAGNOSIS — Z9861 Coronary angioplasty status: Secondary | ICD-10-CM | POA: Diagnosis not present

## 2016-05-07 DIAGNOSIS — F17201 Nicotine dependence, unspecified, in remission: Secondary | ICD-10-CM | POA: Diagnosis not present

## 2016-05-07 DIAGNOSIS — E1129 Type 2 diabetes mellitus with other diabetic kidney complication: Secondary | ICD-10-CM | POA: Diagnosis not present

## 2016-05-07 DIAGNOSIS — Z0000 Encounter for general adult medical examination without abnormal findings: Secondary | ICD-10-CM | POA: Diagnosis not present

## 2016-05-07 DIAGNOSIS — Z955 Presence of coronary angioplasty implant and graft: Secondary | ICD-10-CM | POA: Diagnosis not present

## 2016-05-07 DIAGNOSIS — I10 Essential (primary) hypertension: Secondary | ICD-10-CM | POA: Diagnosis not present

## 2016-05-07 DIAGNOSIS — N183 Chronic kidney disease, stage 3 (moderate): Secondary | ICD-10-CM | POA: Diagnosis not present

## 2016-05-07 DIAGNOSIS — E784 Other hyperlipidemia: Secondary | ICD-10-CM | POA: Diagnosis not present

## 2016-06-01 DIAGNOSIS — Z6827 Body mass index (BMI) 27.0-27.9, adult: Secondary | ICD-10-CM | POA: Diagnosis not present

## 2016-06-01 DIAGNOSIS — I10 Essential (primary) hypertension: Secondary | ICD-10-CM | POA: Diagnosis not present

## 2016-10-29 DIAGNOSIS — M5417 Radiculopathy, lumbosacral region: Secondary | ICD-10-CM | POA: Diagnosis not present

## 2016-10-29 DIAGNOSIS — M9903 Segmental and somatic dysfunction of lumbar region: Secondary | ICD-10-CM | POA: Diagnosis not present

## 2016-11-06 DIAGNOSIS — N401 Enlarged prostate with lower urinary tract symptoms: Secondary | ICD-10-CM | POA: Diagnosis not present

## 2016-11-06 DIAGNOSIS — E784 Other hyperlipidemia: Secondary | ICD-10-CM | POA: Diagnosis not present

## 2016-11-06 DIAGNOSIS — Z6826 Body mass index (BMI) 26.0-26.9, adult: Secondary | ICD-10-CM | POA: Diagnosis not present

## 2016-11-06 DIAGNOSIS — E1129 Type 2 diabetes mellitus with other diabetic kidney complication: Secondary | ICD-10-CM | POA: Diagnosis not present

## 2016-11-06 DIAGNOSIS — Z9861 Coronary angioplasty status: Secondary | ICD-10-CM | POA: Diagnosis not present

## 2016-11-06 DIAGNOSIS — N183 Chronic kidney disease, stage 3 (moderate): Secondary | ICD-10-CM | POA: Diagnosis not present

## 2016-11-06 DIAGNOSIS — I10 Essential (primary) hypertension: Secondary | ICD-10-CM | POA: Diagnosis not present

## 2016-11-06 DIAGNOSIS — M5416 Radiculopathy, lumbar region: Secondary | ICD-10-CM | POA: Diagnosis not present

## 2016-11-13 DIAGNOSIS — M5416 Radiculopathy, lumbar region: Secondary | ICD-10-CM | POA: Diagnosis not present

## 2016-11-13 DIAGNOSIS — I10 Essential (primary) hypertension: Secondary | ICD-10-CM | POA: Diagnosis not present

## 2016-11-13 DIAGNOSIS — E1129 Type 2 diabetes mellitus with other diabetic kidney complication: Secondary | ICD-10-CM | POA: Diagnosis not present

## 2016-11-14 ENCOUNTER — Other Ambulatory Visit: Payer: Self-pay | Admitting: Internal Medicine

## 2016-11-14 DIAGNOSIS — M5416 Radiculopathy, lumbar region: Secondary | ICD-10-CM

## 2016-11-18 ENCOUNTER — Emergency Department (HOSPITAL_COMMUNITY)
Admission: EM | Admit: 2016-11-18 | Discharge: 2016-11-18 | Disposition: A | Discharge status: Home / Self Care | Payer: Medicare Other | Attending: Emergency Medicine | Admitting: Emergency Medicine

## 2016-11-18 ENCOUNTER — Emergency Department (HOSPITAL_COMMUNITY): Payer: Medicare Other

## 2016-11-18 ENCOUNTER — Encounter (HOSPITAL_COMMUNITY): Payer: Self-pay

## 2016-11-18 DIAGNOSIS — I10 Essential (primary) hypertension: Secondary | ICD-10-CM | POA: Insufficient documentation

## 2016-11-18 DIAGNOSIS — I2510 Atherosclerotic heart disease of native coronary artery without angina pectoris: Secondary | ICD-10-CM | POA: Insufficient documentation

## 2016-11-18 DIAGNOSIS — I252 Old myocardial infarction: Secondary | ICD-10-CM | POA: Insufficient documentation

## 2016-11-18 DIAGNOSIS — M25562 Pain in left knee: Secondary | ICD-10-CM | POA: Insufficient documentation

## 2016-11-18 DIAGNOSIS — E119 Type 2 diabetes mellitus without complications: Secondary | ICD-10-CM | POA: Diagnosis not present

## 2016-11-18 DIAGNOSIS — Z79899 Other long term (current) drug therapy: Secondary | ICD-10-CM | POA: Diagnosis not present

## 2016-11-18 HISTORY — DX: Type 2 diabetes mellitus without complications: E11.9

## 2016-11-18 MED ORDER — BUPIVACAINE HCL 0.5 % IJ SOLN
10.0000 mL | Freq: Once | INTRAMUSCULAR | Status: DC
  Filled 2016-11-18: qty 10

## 2016-11-18 MED ORDER — BUPIVACAINE HCL (PF) 0.5 % IJ SOLN
10.0000 mL | Freq: Once | INTRAMUSCULAR | Status: AC
  Administered 2016-11-18: 10 mL
  Filled 2016-11-18: qty 10

## 2016-11-18 MED ORDER — TRIAMCINOLONE ACETONIDE 40 MG/ML IJ SUSP
40.0000 mg | Freq: Once | INTRAMUSCULAR | Status: AC
  Administered 2016-11-18: 40 mg via INTRA_ARTICULAR
  Filled 2016-11-18: qty 1

## 2016-11-18 NOTE — ED Notes (Signed)
Pt verbalized understanding discharge instructions and denies any further needs or questions at this time. VS stable, ambulatory and steady gait.   

## 2016-11-18 NOTE — ED Triage Notes (Signed)
Patient complains of left knee pain with ambulation x 1 month after cleaning lawn mower. Pain with only ROM

## 2016-11-18 NOTE — ED Provider Notes (Signed)
[facility]-EMERGENCY DEPT Provider Note   CSN: [id number] Arrival date & time: 11/18/16  1442  By signing my name below, I, [name]., attest that this documentation has been prepared under the direction and in the presence of Joel Hart, PA-C. Electronically signed: Bing Lucas., ED Scribe. 11/18/16. 3:46 PM.   History   Chief Complaint Chief Complaint  Patient presents with  . Knee Pain    HPI [name] is a 82 y.o. male with hx of heart disease, diabetes mellitus who presents to the Emergency Department complaining of constant, worsening, mild to moderate L knee pain with onset x1 month. Pt states that x1 month ago he developed lower back pain that radiates to the L knee after cleaning his lawnmower. He denies any recent falls or trauma to the L knee and suspects that he may have twisted the L knee. Pt denies back pain at the moment but states that the L knee pain has persisted. Pt can ambulate with the help of his cane. He states that the L knee pain is worsened with bearing weight and palpation. He has used [employer] with no relief. Pt denies L knee swelling, numbness, tingling. He denies hx of knee problems. Of note, pt hs a scheduled MRI by his PCP Dr. Clelia Lucas. Pt lives at home alone.   The history is provided by the patient. No language interpreter was used.    Past Medical History:  Diagnosis Date  . Diabetes mellitus without complication [facility])     Patient Active Problem List   Diagnosis Date Noted  . DIABETES MELLITUS, TYPE II 10/25/2007  . HYPERLIPIDEMIA 10/25/2007  . HYPERTENSION 10/25/2007  . MI 10/25/2007  . CAD 10/25/2007    History reviewed. No pertinent surgical history.     Home Medications    Prior to Admission medications   Not on File    Family History No family history on file.  Social History Social History  Substance Use Topics  . Smoking status: Not on file  . Smokeless tobacco: Not on file  . Alcohol use Not on  file     Allergies   Patient has no allergy information on record.   Review of Systems Review of Systems  Constitutional: Negative for fever.  Musculoskeletal: Positive for arthralgias (L knee). Negative for back pain and joint swelling.  Neurological: Negative for numbness.     Physical Exam Updated Vital Signs BP 121/66   Pulse 80   Temp 98.1 F (36.7 C) (Oral)   Resp 18   SpO2 100%   Physical Exam  Constitutional: He is oriented to person, place, and time. He appears well-developed and well-nourished. No distress.  HENT:  Head: Normocephalic and atraumatic.  Eyes: Conjunctivae are normal. Pupils are equal, round, and reactive to light. Right eye exhibits no discharge. Left eye exhibits no discharge. No scleral icterus.  Neck: Normal range of motion. Neck supple.  Cardiovascular: Normal rate and regular rhythm.   No murmur heard. Pulmonary/Chest: Effort normal and breath sounds normal. No respiratory distress.  Abdominal: Soft. He exhibits no distension. There is no tenderness.  Musculoskeletal: He exhibits no edema.       Left knee: He exhibits no swelling and no deformity. Tenderness found.  No obvious swelling, deformity or warmth. TTP over the quadriceps tendon and the medial and lateral joint lines. Full ROM of the knee. 5/5 strength. Pt has antalgic gait.  Neurological: He is alert and oriented to person, place, and time.  Skin: Skin is warm and dry.  Psychiatric: He has a normal mood and affect. His behavior is normal.  Nursing note and vitals reviewed.     ED Treatments / Results   DIAGNOSTIC STUDIES: Oxygen Saturation is 100% on RA, normal by my interpretation.   COORDINATION OF CARE: 3:55 PM-Discussed next steps with pt. Pt verbalized understanding and is agreeable with the plan.    Labs (all labs ordered are listed, but only abnormal results are displayed) Labs Reviewed - No data to display  EKG  EKG Interpretation None        Radiology No results found.  Procedures Injection of joint Date/Time: 11/18/2016 4:30 PM Performed by: Joel HartGEKAS, Xzandria Clevinger MARIE Authorized by: Joel HartGEKAS, Asah Lamay MARIE  Consent: Verbal consent obtained. Written consent not obtained. Risks and benefits: risks, benefits and alternatives were discussed Consent given by: patient Patient understanding: patient states understanding of the procedure being performed Patient consent: the patient's understanding of the procedure matches consent given Site marked: the operative site was marked Imaging studies: imaging studies available Patient identity confirmed: verbally with patient Time out: Immediately prior to procedure a "time out" was called to verify the correct patient, procedure, equipment, support staff and site/side marked as required. Preparation: Patient was prepped and draped in the usual sterile fashion. Local anesthesia used: yes Anesthesia method: with 40mg  Kenalog.  Anesthesia: Local anesthesia used: yes Local Anesthetic: bupivacaine 0.5% without epinephrine Anesthetic total: 1 mL  Sedation: Patient sedated: no Patient tolerance: Patient tolerated the procedure well with no immediate complications Comments: 40mg  of Kenalog (1mL) + Bupivicaine (1mL) was used    (including critical care time)    Medications Ordered in ED Medications - No data to display   Initial Impression / Assessment and Plan / ED Course  I have reviewed the triage vital signs and the nursing notes.  Pertinent labs & imaging results that were available during my care of the patient were reviewed by me and considered in my medical decision making (see chart for details).  82 year old with acute, persistent L knee pain. Likely due to tendonitis/arthritis. Xray remarkable for mild arthritis. He is having significant difficulty ambulating on exam. Due to his age, I think a steroid injection may help him the most other than taking NSAIDs. Discussed with Dr.  Lynelle Lucas who is in agreement with plan. I advised him to follow up with Orthopedics/PCP. Return precautions given.  Final Clinical Impressions(s) / ED Diagnoses   Final diagnoses:  Acute pain of left knee    New Prescriptions New Prescriptions   No medications on file   I personally performed the services described in this documentation, which was scribed in my presence. The recorded information has been reviewed and is accurate.     Bethel BornGekas, Hazyl Marseille Marie, PA-C 11/20/16 1546    Linwood DibblesKnapp, Jon, MD 11/21/16 [phone]

## 2016-11-18 NOTE — ED Notes (Signed)
Pt in xray

## 2016-11-18 NOTE — Discharge Instructions (Signed)
Take Ibuprofen or Naproxen for pain If your symptoms get better, please follow up with your primary doctor If your symptoms persist, follow up with orthopedics

## 2016-11-25 ENCOUNTER — Other Ambulatory Visit: Payer: Self-pay

## 2016-11-26 ENCOUNTER — Encounter (HOSPITAL_COMMUNITY): Payer: Self-pay

## 2016-11-26 ENCOUNTER — Inpatient Hospital Stay (HOSPITAL_COMMUNITY)
Admission: EM | Admit: 2016-11-26 | Discharge: 2016-11-30 | DRG: 683 | Disposition: A | Discharge status: Skilled Nursing Facility | Payer: Medicare Other | Attending: Internal Medicine | Admitting: Internal Medicine

## 2016-11-26 DIAGNOSIS — M549 Dorsalgia, unspecified: Secondary | ICD-10-CM

## 2016-11-26 DIAGNOSIS — E1159 Type 2 diabetes mellitus with other circulatory complications: Secondary | ICD-10-CM | POA: Diagnosis present

## 2016-11-26 DIAGNOSIS — M5126 Other intervertebral disc displacement, lumbar region: Secondary | ICD-10-CM | POA: Diagnosis not present

## 2016-11-26 DIAGNOSIS — E871 Hypo-osmolality and hyponatremia: Secondary | ICD-10-CM | POA: Diagnosis present

## 2016-11-26 DIAGNOSIS — N179 Acute kidney failure, unspecified: Secondary | ICD-10-CM | POA: Diagnosis not present

## 2016-11-26 DIAGNOSIS — Z87891 Personal history of nicotine dependence: Secondary | ICD-10-CM

## 2016-11-26 DIAGNOSIS — E860 Dehydration: Secondary | ICD-10-CM | POA: Diagnosis present

## 2016-11-26 DIAGNOSIS — I10 Essential (primary) hypertension: Secondary | ICD-10-CM | POA: Diagnosis present

## 2016-11-26 DIAGNOSIS — M7052 Other bursitis of knee, left knee: Secondary | ICD-10-CM | POA: Diagnosis not present

## 2016-11-26 DIAGNOSIS — E1122 Type 2 diabetes mellitus with diabetic chronic kidney disease: Secondary | ICD-10-CM | POA: Diagnosis present

## 2016-11-26 DIAGNOSIS — E785 Hyperlipidemia, unspecified: Secondary | ICD-10-CM | POA: Diagnosis present

## 2016-11-26 DIAGNOSIS — M25562 Pain in left knee: Secondary | ICD-10-CM | POA: Diagnosis present

## 2016-11-26 DIAGNOSIS — Z8249 Family history of ischemic heart disease and other diseases of the circulatory system: Secondary | ICD-10-CM

## 2016-11-26 DIAGNOSIS — I129 Hypertensive chronic kidney disease with stage 1 through stage 4 chronic kidney disease, or unspecified chronic kidney disease: Secondary | ICD-10-CM | POA: Diagnosis present

## 2016-11-26 DIAGNOSIS — M25569 Pain in unspecified knee: Secondary | ICD-10-CM

## 2016-11-26 DIAGNOSIS — Z955 Presence of coronary angioplasty implant and graft: Secondary | ICD-10-CM

## 2016-11-26 DIAGNOSIS — Z79899 Other long term (current) drug therapy: Secondary | ICD-10-CM

## 2016-11-26 DIAGNOSIS — Z7984 Long term (current) use of oral hypoglycemic drugs: Secondary | ICD-10-CM

## 2016-11-26 DIAGNOSIS — Z7902 Long term (current) use of antithrombotics/antiplatelets: Secondary | ICD-10-CM

## 2016-11-26 DIAGNOSIS — I2510 Atherosclerotic heart disease of native coronary artery without angina pectoris: Secondary | ICD-10-CM | POA: Diagnosis present

## 2016-11-26 DIAGNOSIS — N184 Chronic kidney disease, stage 4 (severe): Secondary | ICD-10-CM | POA: Diagnosis present

## 2016-11-26 LAB — BASIC METABOLIC PANEL
ANION GAP: 11 (ref 5–15)
BUN: 66 mg/dL — AB (ref 6–20)
CHLORIDE: 100 mmol/L — AB (ref 101–111)
CO2: 18 mmol/L — AB (ref 22–32)
Calcium: 9.8 mg/dL (ref 8.9–10.3)
Creatinine, Ser: 2.47 mg/dL — ABNORMAL HIGH (ref 0.61–1.24)
GFR calc Af Amer: 27 mL/min — ABNORMAL LOW (ref >60)
GFR calc non Af Amer: 23 mL/min — ABNORMAL LOW (ref >60)
GLUCOSE: 132 mg/dL — AB (ref 65–99)
POTASSIUM: 4.3 mmol/L (ref 3.5–5.1)
Sodium: 129 mmol/L — ABNORMAL LOW (ref 135–145)

## 2016-11-26 LAB — CBC WITH DIFFERENTIAL/PLATELET
Basophils Absolute: 0 10*3/uL (ref 0.0–0.1)
Basophils Relative: 0 %
EOS PCT: 3 %
Eosinophils Absolute: 0.2 10*3/uL (ref 0.0–0.7)
HEMATOCRIT: 36.4 % — AB (ref 39.0–52.0)
HEMOGLOBIN: 12.3 g/dL — AB (ref 13.0–17.0)
LYMPHS ABS: 2 10*3/uL (ref 0.7–4.0)
LYMPHS PCT: 24 %
MCH: 31.1 pg (ref 26.0–34.0)
MCHC: 33.8 g/dL (ref 30.0–36.0)
MCV: 91.9 fL (ref 78.0–100.0)
Monocytes Absolute: 0.7 10*3/uL (ref 0.1–1.0)
Monocytes Relative: 9 %
NEUTROS ABS: 5.3 10*3/uL (ref 1.7–7.7)
Neutrophils Relative %: 64 %
PLATELETS: 161 10*3/uL (ref 150–400)
RBC: 3.96 MIL/uL — AB (ref 4.22–5.81)
RDW: 12.8 % (ref 11.5–15.5)
WBC: 8.2 10*3/uL (ref 4.0–10.5)

## 2016-11-26 LAB — SEDIMENTATION RATE: Sed Rate: 7 mm/hr (ref 0–16)

## 2016-11-26 LAB — CK: Total CK: 62 U/L (ref 49–397)

## 2016-11-26 MED ORDER — LORAZEPAM 2 MG/ML IJ SOLN
0.5000 mg | Freq: Once | INTRAMUSCULAR | Status: AC
  Administered 2016-11-26: 0.5 mg via INTRAVENOUS
  Filled 2016-11-26: qty 1

## 2016-11-26 MED ORDER — FENTANYL CITRATE (PF) 100 MCG/2ML IJ SOLN
50.0000 ug | Freq: Once | INTRAMUSCULAR | Status: AC
  Administered 2016-11-26: 50 ug via INTRAMUSCULAR
  Filled 2016-11-26: qty 2

## 2016-11-26 MED ORDER — IBUPROFEN 400 MG PO TABS
400.0000 mg | ORAL_TABLET | Freq: Once | ORAL | Status: AC
  Administered 2016-11-26: 400 mg via ORAL
  Filled 2016-11-26: qty 1

## 2016-11-26 MED ORDER — HYDROCODONE-ACETAMINOPHEN 5-325 MG PO TABS
1.0000 | ORAL_TABLET | Freq: Once | ORAL | Status: AC
  Administered 2016-11-26: 1 via ORAL
  Filled 2016-11-26: qty 1

## 2016-11-26 MED ORDER — SODIUM CHLORIDE 0.9 % IV SOLN
Freq: Once | INTRAVENOUS | Status: AC
  Administered 2016-11-26: 22:00:00 via INTRAVENOUS

## 2016-11-26 MED ORDER — KETOROLAC TROMETHAMINE 15 MG/ML IJ SOLN
15.0000 mg | Freq: Once | INTRAMUSCULAR | Status: AC
  Administered 2016-11-26: 15 mg via INTRAVENOUS
  Filled 2016-11-26: qty 1

## 2016-11-26 MED ORDER — SODIUM CHLORIDE 0.9 % IV BOLUS (SEPSIS)
500.0000 mL | Freq: Once | INTRAVENOUS | Status: AC
  Administered 2016-11-26: 500 mL via INTRAVENOUS

## 2016-11-26 NOTE — ED Notes (Signed)
Bladder scan 526 mL.

## 2016-11-26 NOTE — ED Provider Notes (Signed)
  Face-to-face evaluation   History: He presents for evaluation of pain in left knee gradually worsening despite recent treatment with steroid injection, intra-articular left knee.  He denies fever but has had chills today.  He is unable to walk today because of the pain and in the last few days has been using either a cane or walker to ambulate because of the pain.  Physical exam: Elderly, alert, trembling with pain.  Left knee exquisitely sensitive to light touch, but without visible deformity.  He is holding both the left arm and the left leg, rigid, and strongly resists movement of either, passively.  Medical screening examination/treatment/procedure(s) were conducted as a shared visit with non-physician practitioner(s) and myself.  I personally evaluated the patient during the encounter   Mancel BaleWentz, Emsley Custer, MD 11/27/16 [phone]

## 2016-11-26 NOTE — Progress Notes (Signed)
Orthopedic Tech Progress Note Patient Details:  Joel CyphersWilliam Lucas February 25, 1935 [id number]  Ortho Devices Type of Ortho Device: Knee Sleeve Ortho Device/Splint Location: provided applied knee sleeve to pt left knee.  pt tolerated fair.  pt left knee. Ortho Device/Splint Interventions: Application, Adjustment   Alvina ChouWilliams, [name] C 11/26/2016, 6:45 PM

## 2016-11-26 NOTE — ED Provider Notes (Signed)
[facility]-EMERGENCY DEPT Provider Note   CSN: [id number] Arrival date & time: 11/26/16  1254     History   Chief Complaint Chief Complaint  Patient presents with  . Knee Pain    HPI [name] is a 82 y.o. male.  HPI [name] is a 82 y.o. male with history of diabetes, hypertension, coronary disease, presents to emergency department complaining of left leg pain. Patient states that pain started about a week ago. He denies any injuries to the knee. States pain is worsened with bearing weight and venting the joint. He was seen in emergency department for the same and was given a steroid intra articular injection. He states that did not help. He was seen by his primary care doctor and was given tramadol which he is taking, but states that is not helping either. States pain sometimes radiates into his quad and into the hip. He denies any numbness or weakness to his left leg. No tingling. Denies history of similar pain in the past. No fever. He has an appointment with orthopedics doctor tomorrow, but was unable to tolerate pain at home and he lives alone and states it is hard for him to get up and get to the bathroom or get around the house.  Past Medical History:  Diagnosis Date  . Diabetes mellitus without complication [facility](HCC)     Patient Active Problem List   Diagnosis Date Noted  . DIABETES MELLITUS, TYPE II 10/25/2007  . HYPERLIPIDEMIA 10/25/2007  . HYPERTENSION 10/25/2007  . MI 10/25/2007  . CAD 10/25/2007    History reviewed. No pertinent surgical history.     Home Medications    Prior to Admission medications   Not on File    Family History No family history on file.  Social History Social History  Substance Use Topics  . Smoking status: Former Smoker    Quit date: 11/27/1986  . Smokeless tobacco: Never Used  . Alcohol use No     Allergies   Patient has no allergy information on record.   Review of Systems Review of Systems  Constitutional: Negative  for chills and fever.  Respiratory: Negative for cough, chest tightness and shortness of breath.   Cardiovascular: Negative for chest pain, palpitations and leg swelling.  Gastrointestinal: Negative for abdominal distention, abdominal pain, diarrhea, nausea and vomiting.  Genitourinary: Negative for dysuria, frequency, hematuria and urgency.  Musculoskeletal: Positive for arthralgias and joint swelling. Negative for myalgias, neck pain and neck stiffness.  Skin: Negative for rash.  Allergic/Immunologic: Negative for immunocompromised state.  Neurological: Negative for dizziness, weakness, light-headedness, numbness and headaches.  All other systems reviewed and are negative.    Physical Exam Updated Vital Signs BP (!) 121/58   Pulse 92   Temp 98.2 F (36.8 C) (Oral)   Resp 14   SpO2 100%   Physical Exam  Constitutional: He appears well-developed and well-nourished. No distress.  HENT:  Head: Normocephalic.  Neck: Normal range of motion. Neck supple.  Cardiovascular: Normal rate, regular rhythm and normal heart sounds.   Pulmonary/Chest: Effort normal and breath sounds normal. No respiratory distress. He has no wheezes. He has no rales.  Musculoskeletal:  Normal-appearing left knee with no obvious swelling or joint effusion. Tenderness to palpation over medial joint and suprapatellar tendon and bursa. Pain with active extension of the knee. Pain with range of motion of the joints. Joint is stable. Dorsal pedal pulses are intact. Joint is not erythematous or warm to the touch  Skin: Skin is  warm and dry.  Nursing note and vitals reviewed.    ED Treatments / Results  Labs (all labs ordered are listed, but only abnormal results are displayed) Labs Reviewed  CBC WITH DIFFERENTIAL/PLATELET - Abnormal; Notable for the following:       Result Value   RBC 3.96 (*)    Hemoglobin 12.3 (*)    HCT 36.4 (*)    All other components within normal limits  BASIC METABOLIC PANEL -  Abnormal; Notable for the following:    Sodium 129 (*)    Chloride 100 (*)    CO2 18 (*)    Glucose, Bld 132 (*)    BUN 66 (*)    Creatinine, Ser 2.47 (*)    GFR calc non Af Amer 23 (*)    GFR calc Af Amer 27 (*)    All other components within normal limits  SEDIMENTATION RATE  CK  URINALYSIS, ROUTINE W REFLEX MICROSCOPIC  SODIUM, URINE, RANDOM  CREATININE, URINE, RANDOM    EKG  EKG Interpretation None       Radiology No results found.  Procedures Procedures (including critical care time)  Medications Ordered in ED Medications  HYDROcodone-acetaminophen (NORCO/VICODIN) 5-325 MG per tablet 1 tablet (1 tablet Oral Given 11/26/16 1815)  ibuprofen (ADVIL,MOTRIN) tablet 400 mg (400 mg Oral Given 11/26/16 1815)  fentaNYL (SUBLIMAZE) injection 50 mcg (50 mcg Intramuscular Given 11/26/16 1903)  ketorolac (TORADOL) 15 MG/ML injection 15 mg (15 mg Intravenous Given 11/26/16 1951)  LORazepam (ATIVAN) injection 0.5 mg (0.5 mg Intravenous Given 11/26/16 1951)  0.9 %  sodium chloride infusion ( Intravenous Not Given 11/26/16 2230)  sodium chloride 0.9 % bolus 500 mL (0 mLs Intravenous Stopped 11/26/16 2351)     Initial Impression / Assessment and Plan / ED Course  I have reviewed the triage vital signs and the nursing notes.  Pertinent labs & imaging results that were available during my care of the patient were reviewed by me and considered in my medical decision making (see chart for details).     Patient with persistent pain to her left knee. Already had intra-articular steroid injection and currently taking tramadol for pain. He is here because the pain is not well-controlled and he cannot ambulate around his house to even go to the bathroom easily. Based on exam, I'm not concerned about joint infection, there is no trauma, x-ray was done last time which showed slight intracondylar spurring, otherwise unremarkable. From chart review looks like patient was scheduled for a lumbar MRI  which he was a no-show to yesterday. I will try to give him some pain medicine, wrap left knee, will reassess.   7:49 PM Pt's pain not improved with vicodin, ibuprofen, fentanyl IM. His pain appears to be worse than when he came in. Now with increased tenderness even to the light touch over the knee. He appears to be very anxious, tearful. Dr Effie Shy saw pt. Will place IV, ativan and toradol IV. Will send labs.  10:12 PM Patient's knee feels better. He is able to bend to it, however still having difficulty getting up. Patient's lab work showed creatinine of 2.47 and BUN of 66. We do not have a recent value to compare to. Patient states he has had blood work done in September, and states that he has never been told that his kidney function was bad. He do adit to not eating or drinking an eating over last few ys because he hasn't been able to walk. I will start  fluids, and admit for rehydration.   Vitals:   11/26/16 1933 11/26/16 1950 11/26/16 2311 11/26/16 2345  BP:  131/67 104/76 104/62  Pulse:  90 94 85  Resp:  (!) 25 16 14   Temp: 97.4 F (36.3 C) 97.9 F (36.6 C) 98.4 F (36.9 C)   TempSrc: Oral Oral Oral   SpO2:  100% 100% 99%     Final Clinical Impressions(s) / ED Diagnoses   Final diagnoses:  Dehydration  Acute pain of left knee    New Prescriptions New Prescriptions   No medications on file     Jaynie Crumble, PA-C 11/27/16 0009    Mancel Bale, MD 11/27/16 [phone]

## 2016-11-26 NOTE — ED Notes (Signed)
Ortho paged for Ace wrap

## 2016-11-26 NOTE — ED Notes (Signed)
Notified of need for urine specimen, provided urinal.

## 2016-11-26 NOTE — ED Notes (Signed)
pts acuity increased to a "3" due to HR of 120

## 2016-11-26 NOTE — ED Triage Notes (Signed)
Pt reports left knee pain/ He was seen here on June 10 and the advil is not helping.

## 2016-11-27 ENCOUNTER — Observation Stay (HOSPITAL_COMMUNITY): Payer: Medicare Other

## 2016-11-27 ENCOUNTER — Encounter (HOSPITAL_COMMUNITY): Payer: Self-pay | Admitting: Internal Medicine

## 2016-11-27 DIAGNOSIS — M5126 Other intervertebral disc displacement, lumbar region: Secondary | ICD-10-CM | POA: Diagnosis present

## 2016-11-27 DIAGNOSIS — I10 Essential (primary) hypertension: Secondary | ICD-10-CM | POA: Diagnosis not present

## 2016-11-27 DIAGNOSIS — M48061 Spinal stenosis, lumbar region without neurogenic claudication: Secondary | ICD-10-CM | POA: Diagnosis not present

## 2016-11-27 DIAGNOSIS — Z955 Presence of coronary angioplasty implant and graft: Secondary | ICD-10-CM | POA: Diagnosis not present

## 2016-11-27 DIAGNOSIS — M7052 Other bursitis of knee, left knee: Secondary | ICD-10-CM | POA: Diagnosis present

## 2016-11-27 DIAGNOSIS — E785 Hyperlipidemia, unspecified: Secondary | ICD-10-CM | POA: Diagnosis present

## 2016-11-27 DIAGNOSIS — I129 Hypertensive chronic kidney disease with stage 1 through stage 4 chronic kidney disease, or unspecified chronic kidney disease: Secondary | ICD-10-CM | POA: Diagnosis present

## 2016-11-27 DIAGNOSIS — Z87891 Personal history of nicotine dependence: Secondary | ICD-10-CM | POA: Diagnosis not present

## 2016-11-27 DIAGNOSIS — N184 Chronic kidney disease, stage 4 (severe): Secondary | ICD-10-CM | POA: Diagnosis present

## 2016-11-27 DIAGNOSIS — M25562 Pain in left knee: Secondary | ICD-10-CM | POA: Diagnosis not present

## 2016-11-27 DIAGNOSIS — R2681 Unsteadiness on feet: Secondary | ICD-10-CM | POA: Diagnosis not present

## 2016-11-27 DIAGNOSIS — R41841 Cognitive communication deficit: Secondary | ICD-10-CM | POA: Diagnosis not present

## 2016-11-27 DIAGNOSIS — N179 Acute kidney failure, unspecified: Secondary | ICD-10-CM | POA: Diagnosis not present

## 2016-11-27 DIAGNOSIS — R278 Other lack of coordination: Secondary | ICD-10-CM | POA: Diagnosis not present

## 2016-11-27 DIAGNOSIS — N189 Chronic kidney disease, unspecified: Secondary | ICD-10-CM | POA: Diagnosis not present

## 2016-11-27 DIAGNOSIS — M545 Low back pain: Secondary | ICD-10-CM | POA: Diagnosis not present

## 2016-11-27 DIAGNOSIS — E1122 Type 2 diabetes mellitus with diabetic chronic kidney disease: Secondary | ICD-10-CM | POA: Diagnosis present

## 2016-11-27 DIAGNOSIS — E860 Dehydration: Secondary | ICD-10-CM | POA: Diagnosis present

## 2016-11-27 DIAGNOSIS — M6281 Muscle weakness (generalized): Secondary | ICD-10-CM | POA: Diagnosis not present

## 2016-11-27 DIAGNOSIS — E871 Hypo-osmolality and hyponatremia: Secondary | ICD-10-CM | POA: Diagnosis present

## 2016-11-27 DIAGNOSIS — M25559 Pain in unspecified hip: Secondary | ICD-10-CM | POA: Diagnosis not present

## 2016-11-27 DIAGNOSIS — Z79899 Other long term (current) drug therapy: Secondary | ICD-10-CM | POA: Diagnosis not present

## 2016-11-27 DIAGNOSIS — Z8249 Family history of ischemic heart disease and other diseases of the circulatory system: Secondary | ICD-10-CM | POA: Diagnosis not present

## 2016-11-27 DIAGNOSIS — Z7902 Long term (current) use of antithrombotics/antiplatelets: Secondary | ICD-10-CM | POA: Diagnosis not present

## 2016-11-27 DIAGNOSIS — Z7984 Long term (current) use of oral hypoglycemic drugs: Secondary | ICD-10-CM | POA: Diagnosis not present

## 2016-11-27 DIAGNOSIS — E1159 Type 2 diabetes mellitus with other circulatory complications: Secondary | ICD-10-CM | POA: Diagnosis not present

## 2016-11-27 DIAGNOSIS — I2510 Atherosclerotic heart disease of native coronary artery without angina pectoris: Secondary | ICD-10-CM | POA: Diagnosis present

## 2016-11-27 LAB — URINALYSIS, ROUTINE W REFLEX MICROSCOPIC
BACTERIA UA: NONE SEEN
BILIRUBIN URINE: NEGATIVE
GLUCOSE, UA: NEGATIVE mg/dL
HGB URINE DIPSTICK: NEGATIVE
Ketones, ur: NEGATIVE mg/dL
NITRITE: NEGATIVE
PROTEIN: NEGATIVE mg/dL
Specific Gravity, Urine: 1.012 (ref 1.005–1.030)
pH: 5 (ref 5.0–8.0)

## 2016-11-27 LAB — BASIC METABOLIC PANEL
ANION GAP: 8 (ref 5–15)
BUN: 57 mg/dL — ABNORMAL HIGH (ref 6–20)
CALCIUM: 8.7 mg/dL — AB (ref 8.9–10.3)
CO2: 20 mmol/L — ABNORMAL LOW (ref 22–32)
Chloride: 108 mmol/L (ref 101–111)
Creatinine, Ser: 2.13 mg/dL — ABNORMAL HIGH (ref 0.61–1.24)
GFR calc Af Amer: 32 mL/min — ABNORMAL LOW (ref >60)
GFR, EST NON AFRICAN AMERICAN: 28 mL/min — AB (ref >60)
Glucose, Bld: 74 mg/dL (ref 65–99)
POTASSIUM: 3.9 mmol/L (ref 3.5–5.1)
SODIUM: 136 mmol/L (ref 135–145)

## 2016-11-27 LAB — CBC
HCT: 33 % — ABNORMAL LOW (ref 39.0–52.0)
Hemoglobin: 10.9 g/dL — ABNORMAL LOW (ref 13.0–17.0)
MCH: 30.8 pg (ref 26.0–34.0)
MCHC: 33 g/dL (ref 30.0–36.0)
MCV: 93.2 fL (ref 78.0–100.0)
PLATELETS: 129 10*3/uL — AB (ref 150–400)
RBC: 3.54 MIL/uL — AB (ref 4.22–5.81)
RDW: 12.9 % (ref 11.5–15.5)
WBC: 7 10*3/uL (ref 4.0–10.5)

## 2016-11-27 LAB — SODIUM, URINE, RANDOM: SODIUM UR: 40 mmol/L

## 2016-11-27 LAB — CREATININE, URINE, RANDOM: Creatinine, Urine: 102.35 mg/dL

## 2016-11-27 MED ORDER — BUPIVACAINE HCL 0.5 % IJ SOLN
50.0000 mL | Freq: Once | INTRAMUSCULAR | Status: AC
  Administered 2016-11-27: 1 mL
  Filled 2016-11-27: qty 50

## 2016-11-27 MED ORDER — ACETAMINOPHEN 325 MG PO TABS: 650.0000 mg | ORAL_TABLET | Freq: Four times a day (QID) | ORAL | Status: DC | PRN

## 2016-11-27 MED ORDER — LIDOCAINE HCL 2 % IJ SOLN
5.0000 mL | Freq: Once | INTRAMUSCULAR | Status: AC
  Administered 2016-11-27: 100 mg
  Filled 2016-11-27: qty 10

## 2016-11-27 MED ORDER — TRAMADOL HCL 50 MG PO TABS
50.0000 mg | ORAL_TABLET | Freq: Three times a day (TID) | ORAL | Status: DC | PRN
  Administered 2016-11-27 (×2): 50 mg via ORAL
  Filled 2016-11-27 (×3): qty 1

## 2016-11-27 MED ORDER — OMEGA-3-ACID ETHYL ESTERS 1 G PO CAPS
2.0000 g | ORAL_CAPSULE | Freq: Two times a day (BID) | ORAL | Status: DC
  Administered 2016-11-27 – 2016-11-30 (×7): 2 g via ORAL
  Filled 2016-11-27 (×7): qty 2

## 2016-11-27 MED ORDER — MORPHINE SULFATE (PF) 2 MG/ML IV SOLN
1.0000 mg | Freq: Once | INTRAVENOUS | Status: AC
  Administered 2016-11-27: 1 mg via INTRAVENOUS
  Filled 2016-11-27: qty 1

## 2016-11-27 MED ORDER — METOPROLOL TARTRATE 50 MG PO TABS
50.0000 mg | ORAL_TABLET | Freq: Two times a day (BID) | ORAL | Status: DC
  Administered 2016-11-27 – 2016-11-30 (×7): 50 mg via ORAL
  Filled 2016-11-27 (×7): qty 1

## 2016-11-27 MED ORDER — MORPHINE SULFATE (PF) 2 MG/ML IV SOLN
2.0000 mg | Freq: Once | INTRAVENOUS | Status: AC
  Administered 2016-11-28: 2 mg via INTRAVENOUS
  Filled 2016-11-27 (×2): qty 1

## 2016-11-27 MED ORDER — OXYCODONE HCL 5 MG PO TABS
5.0000 mg | ORAL_TABLET | Freq: Four times a day (QID) | ORAL | Status: DC | PRN
Start: 2016-11-27 — End: 2016-11-30

## 2016-11-27 MED ORDER — SODIUM CHLORIDE 0.9 % IV SOLN
INTRAVENOUS | Status: DC
  Administered 2016-11-27: 03:00:00 via INTRAVENOUS

## 2016-11-27 MED ORDER — GLIMEPIRIDE 2 MG PO TABS
2.0000 mg | ORAL_TABLET | Freq: Every day | ORAL | Status: DC
  Administered 2016-11-27 – 2016-11-30 (×4): 2 mg via ORAL
  Filled 2016-11-27 (×4): qty 1

## 2016-11-27 MED ORDER — NITROGLYCERIN 0.4 MG SL SUBL: 0.4000 mg | SUBLINGUAL_TABLET | SUBLINGUAL | Status: DC | PRN

## 2016-11-27 MED ORDER — BUPIVACAINE HCL (PF) 0.5 % IJ SOLN
10.0000 mL | Freq: Once | INTRAMUSCULAR | Status: DC
  Filled 2016-11-27: qty 10

## 2016-11-27 MED ORDER — AMLODIPINE BESYLATE 5 MG PO TABS
5.0000 mg | ORAL_TABLET | Freq: Every day | ORAL | Status: DC
Start: 2016-11-27 — End: 2016-11-30
  Administered 2016-11-27 – 2016-11-29 (×3): 5 mg via ORAL
  Filled 2016-11-27 (×4): qty 1

## 2016-11-27 MED ORDER — TAMSULOSIN HCL 0.4 MG PO CAPS
0.4000 mg | ORAL_CAPSULE | Freq: Every day | ORAL | Status: DC
  Administered 2016-11-27 – 2016-11-30 (×4): 0.4 mg via ORAL
  Filled 2016-11-27 (×4): qty 1

## 2016-11-27 MED ORDER — ONDANSETRON HCL 4 MG PO TABS: 4.0000 mg | ORAL_TABLET | Freq: Four times a day (QID) | ORAL | Status: DC | PRN

## 2016-11-27 MED ORDER — CLOPIDOGREL BISULFATE 75 MG PO TABS
75.0000 mg | ORAL_TABLET | Freq: Every day | ORAL | Status: DC
  Administered 2016-11-27 – 2016-11-30 (×4): 75 mg via ORAL
  Filled 2016-11-27 (×4): qty 1

## 2016-11-27 MED ORDER — ACETAMINOPHEN 650 MG RE SUPP: 650.0000 mg | Freq: Four times a day (QID) | RECTAL | Status: DC | PRN

## 2016-11-27 MED ORDER — ATORVASTATIN CALCIUM 40 MG PO TABS
40.0000 mg | ORAL_TABLET | Freq: Every evening | ORAL | Status: DC
  Administered 2016-11-27 – 2016-11-30 (×4): 40 mg via ORAL
  Filled 2016-11-27 (×5): qty 1

## 2016-11-27 MED ORDER — ONDANSETRON HCL 4 MG/2ML IJ SOLN: 4.0000 mg | Freq: Four times a day (QID) | INTRAMUSCULAR | Status: DC | PRN

## 2016-11-27 NOTE — ED Notes (Signed)
Ace wrap removed by PA Tatyana as it makes the pain more significant.

## 2016-11-27 NOTE — Care Management Obs Status (Signed)
MEDICARE OBSERVATION STATUS NOTIFICATION   Patient Details  Name: [name] MRN: [id number] Date of Birth: 1934/09/28   Medicare Observation Status Notification Given:  Yes    Lawerance Sabalebbie Rori Goar, RN 11/27/2016, 9:53 AM

## 2016-11-27 NOTE — Evaluation (Signed)
Physical Therapy Evaluation Patient Details Name: [name] MRN: [id number] DOB: 1935-06-08 Today's Date: 11/27/2016   History of Present Illness  Pt is an 82 yo M suffering from L knee pain and acute renal failure on chronic kidney disease. Pt states that the pain originated in his back with no MOI and radiated down into his lateral thigh and eventually his knee. The back pain resolved, but his knee pain did not. Pt received a shot for the pain, but it did not relieve his symptoms. Pt was living at home and mod I with all ADLs prior to admission.  Clinical Impression  Pt presents with the impairments listed below secondary to the diagnosis above. Orthopedic PA informed SPT, that each time the pt's knee is assessed (within the same day) the pt's knee presents differently. Earlier, the pt was unable to straighten knee, therefore resulting in the MRI being canceled. Upon SPT evaluation, pt was able to use AROM to straighten L knee WFL. Knee flexion and hip IR/ER rotation are the only motions that caused the pt pain actively and passively. Also, pt was unable to perform a SLR on the LLE during today's session, however, the Orthopedic PA reported that he was able to do so earlier in the day with no c/o pain. While pt does not require additional equipment for home, nor reports any difficulties with ADLs, the pt would benefit from continued skilled Physical Therapy to determine if the injection helped relieve any of the pt's pain, which in turn would improve pt's gait. Pt would benefit from further care at an OPPT. Cannot help but wonder if the knee pain is originating from the pt's spine.     Follow Up Recommendations Outpatient PT    Equipment Recommendations  None recommended by PT    Recommendations for Other Services       Precautions / Restrictions Restrictions Weight Bearing Restrictions: No      Mobility  Bed Mobility Overal bed mobility: Modified Independent             General  bed mobility comments: Pt able to perform bed mobility without physical assistance, however, pt required the use of the guard rails and increased time to move from L side-lying > sitting  Transfers Overall transfer level: Needs assistance Equipment used: Rolling walker (2 wheeled) Transfers: Sit to/from Stand Sit to Stand: Min assist         General transfer comment: Pt was unsuccessful in performing a sit > stand with a RW. VC for safety as well as optimal positioning to complete transfer were used. Pt was successful with light min A the second trial.  Ambulation/Gait Ambulation/Gait assistance: Min assist Ambulation Distance (Feet): 30 Feet Assistive device: Rolling walker (2 wheeled) Gait Pattern/deviations: Decreased stance time - left;Decreased step length - right;Antalgic;Decreased weight shift to left   Gait velocity interpretation: Below normal speed for age/gender General Gait Details: Pt unable to fully WB onto LLE. Pt performed self-imposed PWB and ambulated on the ball of his L foot  Stairs            Wheelchair Mobility    Modified Rankin (Stroke Patients Only)       Balance Overall balance assessment: Needs assistance Sitting-balance support: No upper extremity supported;Feet supported Sitting balance-Leahy Scale: Fair     Standing balance support: Bilateral upper extremity supported;During functional activity Standing balance-Leahy Scale: Poor  Pertinent Vitals/Pain Pain Assessment: 0-10 Pain Score: 6  (6/10 at the beginning of session, 10/10 at the end) Pain Location: L knee Pain Descriptors / Indicators: Sharp Pain Intervention(s): Limited activity within patient's tolerance;Monitored during session;Other (comment) (Orthopedic PA treated pt and performed an injection into the knee joint to decrease pain)    Home Living Family/patient expects to be discharged to:: Private residence Living Arrangements:  Alone   Type of Home: House Home Access: Stairs to enter   [employer] of Steps: 1 Home Layout: One level;Other (Comment) (has one step in his home) Home Equipment: Walker - 2 wheels;Cane - single point      Prior Function Level of Independence: Independent with assistive device(s)         Comments: Pt priorily used his cane in the community and in his home, but has had to use his RW due to his increasing pain. Pt is mod I with all ADLs and states that he only has difficulties getting into and out of bed.     Hand Dominance        Extremity/Trunk Assessment        Lower Extremity Assessment Lower Extremity Assessment: Generalized weakness;LLE deficits/detail LLE Deficits / Details: Pt was unable to perfrom a SLR on the L while supine. Pt was limited by strength, not pain. Pt stated he experienced pain during the following PROM exercises: IR/ER in a 90/90 position, knee flexion. All other PROM/AROM was WFL LLE: Unable to fully assess due to pain       Communication   Communication: No difficulties  Cognition Arousal/Alertness: Awake/alert Behavior During Therapy: WFL for tasks assessed/performed Overall Cognitive Status: Within Functional Limits for tasks assessed                                        General Comments      Exercises     Assessment/Plan    PT Assessment Patient needs continued PT services  PT Problem List Decreased activity tolerance;Decreased safety awareness;Pain       PT Treatment Interventions DME instruction;Gait training;Stair training;Functional mobility training;Therapeutic activities;Therapeutic exercise;Balance training;Neuromuscular re-education    PT Goals (Current goals can be found in the Care Plan section)  Acute Rehab PT Goals Patient Stated Goal: to decrease pain PT Goal Formulation: With patient Time For Goal Achievement: 12/04/16 Potential to Achieve Goals: Fair    Frequency Min 3X/week    Barriers to discharge        Co-evaluation               AM-PAC PT "6 Clicks" Daily Activity  Outcome Measure Difficulty turning over in bed (including adjusting bedclothes, sheets and blankets)?: A Little Difficulty moving from lying on back to sitting on the side of the bed? : A Lot Difficulty sitting down on and standing up from a chair with arms (e.g., wheelchair, bedside commode, etc,.)?: Total Help needed moving to and from a bed to chair (including a wheelchair)?: A Lot Help needed walking in hospital room?: A Lot Help needed climbing 3-5 steps with a railing? : A Lot 6 Click Score: 12    End of Session Equipment Utilized During Treatment: Gait belt Activity Tolerance: Patient limited by pain Patient left: in bed;with call bell/phone within reach;with bed alarm set;Other (comment) (with PA in room)   PT Visit Diagnosis: Other abnormalities of gait and mobility (R26.89);Other symptoms and signs  involving the nervous system (R29.898);Pain    Time: [id number] PT Time Calculation (min) (ACUTE ONLY): 30 min   Charges:   PT Evaluation $PT Eval Moderate Complexity: 1 Procedure PT Treatments $Gait Training: 8-22 mins   PT G Codes:   PT G-Codes **NOT FOR INPATIENT CLASS** Functional Assessment Tool Used: Clinical judgement Functional Limitation: Mobility: Walking and moving around Mobility: Walking and Moving Around Current Status ([id number](G8978): At least 1 percent but less than 20 percent impaired, limited or restricted Mobility: Walking and Moving Around Goal Status [phone](G8979): 0 percent impaired, limited or restricted    Olin HauserJenna Param Capri, SPT [phone]   Eileen StanfordJenna Lliam Hoh 11/27/2016, 3:00 PM

## 2016-11-27 NOTE — Progress Notes (Signed)
Patient ID: [name], male   DOB: 02-22-1935, 82 y.o.   MRN: [id number] I have seen and examined the patient and reviewed his left knee x-rays.  He appears comfortable in bed at the moment and his knee exam is benign.  There is no knee joint effusion and no redness.  The knee feels stable on exam of the ligaments and I can easily put his left knee thru full motion without any significant discomfort.  He does have some trochanteric pain consistent with trochanteric bursitis on the left side.  His hip exam is otherwise normal.  He states that the severe pain has been around his knee cap, but this does not show any discomfort on my exam.  Certainly this could be a lumbar spine issue.  I would recommend attempts to mobilize him with therapy and try NSAIDs if tolerated.  He may need a MRI of his lumbar spine if the knee does not show anything significant.

## 2016-11-27 NOTE — Procedures (Signed)
Procedure: Left knee aspiration and injection  Indication: Left knee pain  Surgeon: Charma IgoMichael Vasco Chong, PA-C  Assist: None  Anesthesia: None  EBL: None  Complications: None  Findings: After risks/benefits explained patient desires to undergo procedure. Left knee was sterilely prepped and aspirated. Normal appearing synovial fluid encountered. Injected with 2.95ml lidocaine and 2.955ml marcaine. Pt tolerated the procedure well.    Freeman CaldronMichael J. Salik Grewell, PA-C Orthopedic Surgery [phone]

## 2016-11-27 NOTE — Plan of Care (Signed)
Problem: Education: Goal: Knowledge of [place] [facility] Education information/materials will improve Outcome: Progressing POC reviewed with pt. with orders wriiten so far.

## 2016-11-27 NOTE — Progress Notes (Signed)
    Joel CyphersWilliam [name], is a 82 y.o. male, DOB - [birth year], ZOX:[id number]   Patient admitted 4 hrs ago, L knee pain and ARF, Called Ortho & PT, patient stable, follow MRI L knee and BMP.  Vitals:   11/27/16 0015 11/27/16 0058 11/27/16 0647 11/27/16 0900  BP: 103/64 114/62 134/71 111/65  Pulse: 84 91 89 94  Resp: 19 16 16 17   Temp:  97.5 F (36.4 C) 97.6 F (36.4 C) 97.5 F (36.4 C)  TempSrc:  Oral Oral Oral  SpO2: 98% 100% 100% 99%  Weight:  68.5 kg (151 lb 1.6 oz)          Data Review   Micro Results No results found for this or any previous visit (from the past 240 hour(s)).  Radiology Reports Dg Knee Complete 4 Views Left  Result Date: 11/18/2016 CLINICAL DATA:  Pain EXAM: LEFT KNEE - COMPLETE 4+ VIEW COMPARISON:  None. FINDINGS: Frontal, lateral, and bilateral oblique views were obtained. There is no fracture or dislocation. No joint effusion. The joint spaces are normal. There is slight intracondylar spurring. There are multiple foci of arterial vascular calcification. IMPRESSION: Slight intracondylar spurring. No fracture or joint space narrowing. No joint effusion. Extensive atherosclerosis involving multiple lower extremity arterial vessels. Electronically Signed   By: Bretta BangWilliam  Woodruff III M.D.   On: 11/18/2016 15:47    CBC  Recent Labs Lab 11/26/16 1919 11/27/16 0729  WBC 8.2 7.0  HGB 12.3* 10.9*  HCT 36.4* 33.0*  PLT 161 129*  MCV 91.9 93.2  MCH 31.1 30.8  MCHC 33.8 33.0  RDW 12.8 12.9  LYMPHSABS 2.0  --   MONOABS 0.7  --   EOSABS 0.2  --   BASOSABS 0.0  --     Chemistries   Recent Labs Lab 11/26/16 1919 11/27/16 0729  NA 129* 136  K 4.3 3.9  CL 100* 108  CO2 18* 20*  GLUCOSE 132* 74  BUN 66* 57*  CREATININE 2.47* 2.13*  CALCIUM 9.8 8.7*   ------------------------------------------------------------------------------------------------------------------ CrCl  cannot be calculated (Unknown ideal weight.). ------------------------------------------------------------------------------------------------------------------ No results for input(s): HGBA1C in the last 72 hours. ------------------------------------------------------------------------------------------------------------------ No results for input(s): CHOL, HDL, LDLCALC, TRIG, CHOLHDL, LDLDIRECT in the last 72 hours. ------------------------------------------------------------------------------------------------------------------ No results for input(s): TSH, T4TOTAL, T3FREE, THYROIDAB in the last 72 hours.  Invalid input(s): FREET3 ------------------------------------------------------------------------------------------------------------------ No results for input(s): VITAMINB12, FOLATE, FERRITIN, TIBC, IRON, RETICCTPCT in the last 72 hours.  Coagulation profile No results for input(s): INR, PROTIME in the last 168 hours.  No results for input(s): DDIMER in the last 72 hours.  Cardiac Enzymes No results for input(s): CKMB, TROPONINI, MYOGLOBIN in the last 168 hours.  Invalid input(s): CK ------------------------------------------------------------------------------------------------------------------ Invalid input(s): POCBNP

## 2016-11-27 NOTE — Consult Note (Signed)
Reason for Consult:Knee pain Referring Physician: [name] is an 82 y.o. male.  HPI: [name] was admitted to the hospital for a month-long history of left knee pain. There was no precipitating event that he knows of; he just woke up with it one day. It began as back pain and then moved into his left lateral thigh and knee. The back pain has since resolved. The knee pain has no circadian pattern. It is worse when ambulating but has not kept him from doing so. The pain varies in intensity but is always there. He denies any radiation, swelling, N/T. One prior e/o about 5-6 years ago but resolved after 4-5d. He was seen in ED on 6/10 and received joint injection with 49m Kenalog and 185mMarcaine that he says did not help at all.  Past Medical History:  Diagnosis Date  . Diabetes mellitus without complication ([facility]    History reviewed. No pertinent surgical history.  Family History  Problem Relation Age of Onset  . Hypertension Other     Social History:  reports that he quit smoking about 30 years ago. He has never used smokeless tobacco. He reports that he does not drink alcohol. His drug history is not on file.  Allergies: No Known Allergies  Medications: I have reviewed the patient's current medications.  Results for orders placed or performed during the hospital encounter of 11/26/16 (from the past 48 hour(s))  Sedimentation rate     Status: None   Collection Time: 11/26/16  7:19 PM  Result Value Ref Range   Sed Rate 7 0 - 16 mm/hr  CBC with Differential     Status: Abnormal   Collection Time: 11/26/16  7:19 PM  Result Value Ref Range   WBC 8.2 4.0 - 10.5 K/uL   RBC 3.96 (L) 4.22 - 5.81 MIL/uL   Hemoglobin 12.3 (L) 13.0 - 17.0 g/dL   HCT 36.4 (L) 39.0 - 52.0 %   MCV 91.9 78.0 - 100.0 fL   MCH 31.1 26.0 - 34.0 pg   MCHC 33.8 30.0 - 36.0 g/dL   RDW 12.8 11.5 - 15.5 %   Platelets 161 150 - 400 K/uL   Neutrophils Relative % 64 %   Neutro Abs 5.3 1.7 - 7.7 K/uL   Lymphocytes Relative 24 %   Lymphs Abs 2.0 0.7 - 4.0 K/uL   Monocytes Relative 9 %   Monocytes Absolute 0.7 0.1 - 1.0 K/uL   Eosinophils Relative 3 %   Eosinophils Absolute 0.2 0.0 - 0.7 K/uL   Basophils Relative 0 %   Basophils Absolute 0.0 0.0 - 0.1 K/uL  Basic metabolic panel     Status: Abnormal   Collection Time: 11/26/16  7:19 PM  Result Value Ref Range   Sodium 129 (L) 135 - 145 mmol/L   Potassium 4.3 3.5 - 5.1 mmol/L   Chloride 100 (L) 101 - 111 mmol/L   CO2 18 (L) 22 - 32 mmol/L   Glucose, Bld 132 (H) 65 - 99 mg/dL   BUN 66 (H) 6 - 20 mg/dL   Creatinine, Ser 2.47 (H) 0.61 - 1.24 mg/dL   Calcium 9.8 8.9 - 10.3 mg/dL   GFR calc non Af Amer 23 (L) >60 mL/min   GFR calc Af Amer 27 (L) >60 mL/min    Comment: (NOTE) The eGFR has been calculated using the CKD EPI equation. This calculation has not been validated in all clinical situations. eGFR's persistently <60 mL/min signify possible Chronic  Kidney Disease.    Anion gap 11 5 - 15  CK     Status: None   Collection Time: 11/26/16 10:55 PM  Result Value Ref Range   Total CK 62 49 - 397 U/L  Urinalysis, Routine w reflex microscopic     Status: Abnormal   Collection Time: 11/27/16  1:34 AM  Result Value Ref Range   Color, Urine YELLOW YELLOW   APPearance HAZY (A) CLEAR   Specific Gravity, Urine 1.012 1.005 - 1.030   pH 5.0 5.0 - 8.0   Glucose, UA NEGATIVE NEGATIVE mg/dL   Hgb urine dipstick NEGATIVE NEGATIVE   Bilirubin Urine NEGATIVE NEGATIVE   Ketones, ur NEGATIVE NEGATIVE mg/dL   Protein, ur NEGATIVE NEGATIVE mg/dL   Nitrite NEGATIVE NEGATIVE   Leukocytes, UA TRACE (A) NEGATIVE   RBC / HPF 0-5 0 - 5 RBC/hpf   WBC, UA 0-5 0 - 5 WBC/hpf   Bacteria, UA NONE SEEN NONE SEEN   Squamous Epithelial / LPF 0-5 (A) NONE SEEN   Mucous PRESENT    Hyaline Casts, UA PRESENT   Sodium, urine, random     Status: None   Collection Time: 11/27/16  1:34 AM  Result Value Ref Range   Sodium, Ur 40 mmol/L  Creatinine, urine, random      Status: None   Collection Time: 11/27/16  1:34 AM  Result Value Ref Range   Creatinine, Urine 102.35 mg/dL  CBC     Status: Abnormal   Collection Time: 11/27/16  7:29 AM  Result Value Ref Range   WBC 7.0 4.0 - 10.5 K/uL   RBC 3.54 (L) 4.22 - 5.81 MIL/uL   Hemoglobin 10.9 (L) 13.0 - 17.0 g/dL   HCT 33.0 (L) 39.0 - 52.0 %   MCV 93.2 78.0 - 100.0 fL   MCH 30.8 26.0 - 34.0 pg   MCHC 33.0 30.0 - 36.0 g/dL   RDW 12.9 11.5 - 15.5 %   Platelets 129 (L) 150 - 400 K/uL  Basic metabolic panel     Status: Abnormal   Collection Time: 11/27/16  7:29 AM  Result Value Ref Range   Sodium 136 135 - 145 mmol/L   Potassium 3.9 3.5 - 5.1 mmol/L   Chloride 108 101 - 111 mmol/L   CO2 20 (L) 22 - 32 mmol/L   Glucose, Bld 74 65 - 99 mg/dL   BUN 57 (H) 6 - 20 mg/dL   Creatinine, Ser 2.13 (H) 0.61 - 1.24 mg/dL   Calcium 8.7 (L) 8.9 - 10.3 mg/dL   GFR calc non Af Amer 28 (L) >60 mL/min   GFR calc Af Amer 32 (L) >60 mL/min    Comment: (NOTE) The eGFR has been calculated using the CKD EPI equation. This calculation has not been validated in all clinical situations. eGFR's persistently <60 mL/min signify possible Chronic Kidney Disease.    Anion gap 8 5 - 15    No results found.  Review of Systems  Constitutional: Negative for weight loss.  HENT: Negative for ear discharge, ear pain, hearing loss and tinnitus.   Eyes: Negative for blurred vision, double vision, photophobia and pain.  Respiratory: Negative for cough, sputum production and shortness of breath.   Cardiovascular: Negative for chest pain.  Gastrointestinal: Negative for abdominal pain, nausea and vomiting.  Genitourinary: Negative for dysuria, flank pain, frequency and urgency.  Musculoskeletal: Positive for back pain and joint pain (Left knee). Negative for falls, myalgias and neck pain.  Neurological: Negative for  dizziness, tingling, sensory change, focal weakness, loss of consciousness and headaches.  Endo/Heme/Allergies: Does  not bruise/bleed easily.  Psychiatric/Behavioral: Negative for depression, memory loss and substance abuse. The patient is not nervous/anxious.    Blood pressure 111/65, pulse 94, temperature 97.5 F (36.4 C), temperature source Oral, resp. rate 17, weight 68.5 kg (151 lb 1.6 oz), SpO2 99 %. Physical Exam  Constitutional: He appears well-developed and well-nourished. No distress.  HENT:  Head: Normocephalic.  Eyes: Conjunctivae are normal. Right eye exhibits no discharge. Left eye exhibits no discharge. No scleral icterus.  Cardiovascular: Normal rate and regular rhythm.   Respiratory: Effort normal. No respiratory distress.  Musculoskeletal:  RLE No traumatic wounds, ecchymosis, or rash  Nontender  No effusions  Knee stable to varus/ valgus and anterior/posterior stress  Sens DPN, SPN, TN intact  Motor EHL, ext, flex, evers 5/5  DP 1+, PT 0, No significant edema   LLE No traumatic wounds, ecchymosis, or rash             Negative SLR  Knee diffuse mild TTP, normal AROM/PROM but very apprehensive about movement  No effusions  Knee stable to varus/ valgus and anterior/posterior stress  Sens DPN, SPN, TN intact  Motor EHL, ext, flex, evers 5/5             Negative Homan's  Pop 2+, DP 1+, PT 0, No significant edema  Skin: He is not diaphoretic.    Assessment/Plan: Left knee pain -- Unusual presentation. X-ray does not show significant arthritic changes. There's no significant effusion to suggest gout. The initial back pain and lateral thigh radiation suggest a radicular etiology though his negative SLR is puzzling. He may have IT band pathology or PFPS. Will inject joint with anesthetic (again) to make sure injection was in joint space. If no relief with this we can rule out joint etiology. Recommend adding lumbar MRI to knee. He may benefit from PT at an OP center where modalities such as iontophoresis can be employed. His CKR rules out antiinflammatories unfortunately. Once discharged  he should follow up with Dr. Ninfa Linden.    Lisette Abu, PA-C Orthopedic Surgery [phone] 11/27/2016, 10:47 AM

## 2016-11-27 NOTE — H&P (Signed)
History and Physical    Joel CyphersWilliam Dozal WGN:[id number] DOB: 19-Jun-1934 DOA: 11/26/2016  PCP: Patient, No Pcp Per  Patient coming from: Home.  Chief Complaint: Left knee pain.  HPI: [name] is a 82 y.o. male with history of CAD status post stenting, diabetes mellitus, hyperlipidemia, hypertension presents to the ER because of worsening left knee pain. Patient states over the last 1 month patient has been having increasing pain in the left knee were unable to ambulate. Pain increases on standing. Denies any trauma or fall. Patient had come to the ER last week and had a steroid shot is present which patient is still having pain. Patient's primary care physician was planning to have MRI done as outpatient.    ED Course: In the ER patient's labs revealed acute renal failure probably on chronic kidney disease with creatinine of 2.4. Patient states he was not aware of being having any kidney disease. Reviewing patient's labs in the Epic patient's last creatinine was around 1.4 in 2011. Patient is being admitted for further management of left knee pain and renal failure.  Review of Systems: As per HPI, rest all negative.   Past Medical History:  Diagnosis Date  . Diabetes mellitus without complication (HCC)     History reviewed. No pertinent surgical history.   reports that he quit smoking about 30 years ago. He has never used smokeless tobacco. He reports that he does not drink alcohol. His drug history is not on file.  No Known Allergies  Family History  Problem Relation Age of Onset  . Hypertension Other     Prior to Admission medications   Medication Sig Start Date End Date Taking? Authorizing Provider  amLODipine (NORVASC) 5 MG tablet Take 5 mg by mouth daily. 11/26/16  Yes [provider]  atorvastatin (LIPITOR) 40 MG tablet Take 40 mg by mouth every evening. 11/22/16  Yes [provider]  clopidogrel (PLAVIX) 75 MG tablet Take 75 mg by mouth daily. 10/25/16  Yes  [provider]  glimepiride (AMARYL) 2 MG tablet Take 2 mg by mouth daily. 10/22/16  Yes [provider]  hydrochlorothiazide (HYDRODIURIL) 12.5 MG tablet Take 12.5 mg by mouth daily. 11/26/16  Yes [provider]  metFORMIN (GLUCOPHAGE) 1000 MG tablet Take 1,000 mg by mouth 2 (two) times daily. 11/08/16  Yes [provider]  metoprolol tartrate (LOPRESSOR) 50 MG tablet Take 50 mg by mouth 2 (two) times daily. 11/15/16  Yes [provider]  nitroGLYCERIN (NITROSTAT) 0.4 MG SL tablet Place 0.4 mg under the tongue every 5 (five) minutes as needed for chest pain.  11/26/16  Yes [provider]  omega-3 acid ethyl esters (LOVAZA) 1 g capsule Take 2 g by mouth 2 (two) times daily. 09/10/16  Yes [provider]  tamsulosin (FLOMAX) 0.4 MG CAPS capsule Take 0.4 mg by mouth daily. 11/06/16  Yes [provider]  traMADol (ULTRAM) 50 MG tablet Take 50 mg by mouth 3 (three) times daily as needed for moderate pain.  11/06/16  Yes [provider]    Physical Exam: Vitals:   11/26/16 2311 11/26/16 2345 11/27/16 0015 11/27/16 0058  BP: 104/76 104/62 103/64 114/62  Pulse: 94 85 84 91  Resp: 16 14 19 16   Temp: 98.4 F (36.9 C)   97.5 F (36.4 C)  TempSrc: Oral   Oral  SpO2: 100% 99% 98% 100%  Weight:    68.5 kg (151 lb 1.6 oz)      Constitutional: Moderately  built and nourished. Vitals:   11/26/16 2311 11/26/16 2345 11/27/16 0015 11/27/16 0058  BP: 104/76 104/62 103/64 114/62  Pulse: 94 85 84 91  Resp: 16 14 19 16   Temp: 98.4 F (36.9 C)   97.5 F (36.4 C)  TempSrc: Oral   Oral  SpO2: 100% 99% 98% 100%  Weight:    68.5 kg (151 lb 1.6 oz)   Eyes: Anicteric. No pallor. ENMT: No discharge from the ears eyes nose or mouth. Neck: No neck rigidity no mass felt. No JVD appreciated. Respiratory: No rhonchi or crepitations. Cardiovascular: S1-S2, no murmurs appreciated. Abdomen: Soft nontender bowel sounds  present. Musculoskeletal: No obvious left knee effusion. Mild tenderness around the knee joint. No erythema. Skin: No skin rash. Neurologic: Alert awake oriented to time place and person. Moves all extremities. Psychiatric: Appears normal. Normal affect.   Labs on Admission: I have personally reviewed following labs and imaging studies  CBC:  Recent Labs Lab 11/26/16 1919  WBC 8.2  NEUTROABS 5.3  HGB 12.3*  HCT 36.4*  MCV 91.9  PLT 161   Basic Metabolic Panel:  Recent Labs Lab 11/26/16 1919  NA 129*  K 4.3  CL 100*  CO2 18*  GLUCOSE 132*  BUN 66*  CREATININE 2.47*  CALCIUM 9.8   GFR: CrCl cannot be calculated (Unknown ideal weight.). Liver Function Tests: No results for input(s): AST, ALT, ALKPHOS, BILITOT, PROT, ALBUMIN in the last 168 hours. No results for input(s): LIPASE, AMYLASE in the last 168 hours. No results for input(s): AMMONIA in the last 168 hours. Coagulation Profile: No results for input(s): INR, PROTIME in the last 168 hours. Cardiac Enzymes:  Recent Labs Lab 11/26/16 2255  CKTOTAL 62   BNP (last 3 results) No results for input(s): PROBNP in the last 8760 hours. HbA1C: No results for input(s): HGBA1C in the last 72 hours. CBG: No results for input(s): GLUCAP in the last 168 hours. Lipid Profile: No results for input(s): CHOL, HDL, LDLCALC, TRIG, CHOLHDL, LDLDIRECT in the last 72 hours. Thyroid Function Tests: No results for input(s): TSH, T4TOTAL, FREET4, T3FREE, THYROIDAB in the last 72 hours. Anemia Panel: No results for input(s): VITAMINB12, FOLATE, FERRITIN, TIBC, IRON, RETICCTPCT in the last 72 hours. Urine analysis:    Component Value Date/Time   COLORURINE YELLOW 11/27/2016 0134   APPEARANCEUR HAZY (A) 11/27/2016 0134   LABSPEC 1.012 11/27/2016 0134   PHURINE 5.0 11/27/2016 0134   GLUCOSEU NEGATIVE 11/27/2016 0134   HGBUR NEGATIVE 11/27/2016 0134   BILIRUBINUR NEGATIVE 11/27/2016 0134   KETONESUR NEGATIVE 11/27/2016 0134    PROTEINUR NEGATIVE 11/27/2016 0134   NITRITE NEGATIVE 11/27/2016 0134   LEUKOCYTESUR TRACE (A) 11/27/2016 0134   Sepsis Labs: @LABRCNTIP (procalcitonin:4,lacticidven:4) )No results found for this or any previous visit (from the past 240 hour(s)).   Radiological Exams on Admission: No results found.    Assessment/Plan Principal Problem:   ARF (acute renal failure) (HCC) Active Problems:   Type 2 diabetes mellitus with vascular disease (HCC)   Essential hypertension   Dehydration   Acute pain of left knee    1. Acute on chronic renal failure probably stage III to 4 - hold hydrochlorothiazide. Renal failure likely from poor oral intake due to patient unable to ambulate. Gently hydrate. Check FENa. Full metabolic panel. 2. Mild hyponatremia probably from dehydration hydrate and recheck. Hold hydrochlorothiazide. 3. Left knee pain - check MRI. 4. CAD status post stenting - denies any chest pain. On Plavix and metoprolol. 5. Hypertension on amlodipine. Hold  hydrochlorothiazide secondary to hyponatremia and renal failure. 6. Diabetes mellitus type 2 - continue home medications with sliding scale coverage. If creatinine worsens may have to hold oral hypoglycemics.   DVT prophylaxis: SCDs. Code Status: Full code.  Family Communication: Discussed with patient.  Disposition Plan: Home.  Consults called: Physical therapy.  Admission status: Observation.    Eduard Clos MD [facility] Pager [phone].  If 7PM-7AM, please contact night-coverage [email or web address] Password [email or web address]  11/27/2016, 2:18 AM

## 2016-11-28 ENCOUNTER — Inpatient Hospital Stay (HOSPITAL_COMMUNITY): Payer: Medicare Other

## 2016-11-28 DIAGNOSIS — N179 Acute kidney failure, unspecified: Principal | ICD-10-CM

## 2016-11-28 LAB — CBC
HCT: 33.5 % — ABNORMAL LOW (ref 39.0–52.0)
HEMOGLOBIN: 10.9 g/dL — AB (ref 13.0–17.0)
MCH: 31 pg (ref 26.0–34.0)
MCHC: 32.5 g/dL (ref 30.0–36.0)
MCV: 95.2 fL (ref 78.0–100.0)
Platelets: 140 10*3/uL — ABNORMAL LOW (ref 150–400)
RBC: 3.52 MIL/uL — ABNORMAL LOW (ref 4.22–5.81)
RDW: 13 % (ref 11.5–15.5)
WBC: 6.9 10*3/uL (ref 4.0–10.5)

## 2016-11-28 LAB — BASIC METABOLIC PANEL
ANION GAP: 6 (ref 5–15)
BUN: 37 mg/dL — ABNORMAL HIGH (ref 6–20)
CALCIUM: 8.5 mg/dL — AB (ref 8.9–10.3)
CO2: 23 mmol/L (ref 22–32)
CREATININE: 1.74 mg/dL — AB (ref 0.61–1.24)
Chloride: 108 mmol/L (ref 101–111)
GFR calc Af Amer: 41 mL/min — ABNORMAL LOW (ref >60)
GFR, EST NON AFRICAN AMERICAN: 35 mL/min — AB (ref >60)
GLUCOSE: 95 mg/dL (ref 65–99)
Potassium: 4.1 mmol/L (ref 3.5–5.1)
Sodium: 137 mmol/L (ref 135–145)

## 2016-11-28 NOTE — Progress Notes (Signed)
Physical Therapy Treatment Patient Details Name: Daniyal Tabor MRN: [id number] DOB: May 22, 1935 Today's Date: 11/28/2016    History of Present Illness Pt is an 82 yo M suffering from L knee pain and acute renal failure on chronic kidney disease. Pt states that the pain originated in his back with no MOI and radiated down into his lateral thigh and eventually his knee. The back pain resolved, but his knee pain did not. Pt received a shot for the pain, but it did not relieve his symptoms. Pt was living at home and mod I with all ADLs prior to admission.    PT Comments    Per MD notes, pt has been dx with a herniated disc that is impinging his spinal root. Pt has been informed by MD and is relieved to hear the news. Pt ambulated 20' today WB heavily through BUE onto the RW and completed a SPT from the bed to the recliner. The pt states sitting in the chair improved his pain. VC were used throughout for hand placement to ensure safe transfers during sit <> standing. Current POC remains appropriate and pt is agreeable.    Follow Up Recommendations  Outpatient PT     Equipment Recommendations  None recommended by PT    Recommendations for Other Services       Precautions / Restrictions Precautions Precautions: None Restrictions Weight Bearing Restrictions: No    Mobility  Bed Mobility Overal bed mobility: Modified Independent                Transfers Overall transfer level: Needs assistance Equipment used: Rolling walker (2 wheeled) Transfers: Sit to/from Stand;Stand Pivot Transfers Sit to Stand: Min guard Stand pivot transfers: Min guard       General transfer comment: Pt able to complete sit >stand with min guard today. VC utilized for hand placement on bed vs RW for safety. Pt completed SPT from bed to recliner  Ambulation/Gait Ambulation/Gait assistance: Min assist Ambulation Distance (Feet): 20 Feet   Gait Pattern/deviations: Decreased stance time - left;Decreased  step length - right;Antalgic;Decreased weight shift to left   Gait velocity interpretation: at or above normal speed for age/gender General Gait Details: Pt unable to fully WB onto LLE. Pt performed self-imposed PWB and ambulated on the ball of his L foot   Stairs            Wheelchair Mobility    Modified Rankin (Stroke Patients Only)       Balance Overall balance assessment: Needs assistance Sitting-balance support: No upper extremity supported;Feet supported Sitting balance-Leahy Scale: Fair     Standing balance support: Bilateral upper extremity supported;During functional activity Standing balance-Leahy Scale: Poor                              Cognition Arousal/Alertness: Awake/alert Behavior During Therapy: WFL for tasks assessed/performed Overall Cognitive Status: Within Functional Limits for tasks assessed                                        Exercises      General Comments        Pertinent Vitals/Pain Pain Assessment: 0-10 Pain Score: 8  Pain Location: L knee Pain Descriptors / Indicators: Sharp;Dull;Other (Comment) (pt states pain is less intense than yesterday ) Pain Intervention(s): Limited activity within patient's tolerance;Monitored during session  Home Living                      Prior Function            PT Goals (current goals can now be found in the care plan section) Acute Rehab PT Goals Patient Stated Goal: to decrease pain PT Goal Formulation: With patient Time For Goal Achievement: 12/04/16 Potential to Achieve Goals: Fair Progress towards PT goals: Progressing toward goals    Frequency    Min 3X/week      PT Plan Current plan remains appropriate    Co-evaluation              AM-PAC PT "6 Clicks" Daily Activity  Outcome Measure  Difficulty turning over in bed (including adjusting bedclothes, sheets and blankets)?: A Little Difficulty moving from lying on back to  sitting on the side of the bed? : A Little Difficulty sitting down on and standing up from a chair with arms (e.g., wheelchair, bedside commode, etc,.)?: Total Help needed moving to and from a bed to chair (including a wheelchair)?: A Little Help needed walking in hospital room?: A Lot Help needed climbing 3-5 steps with a railing? : A Lot 6 Click Score: 14    End of Session Equipment Utilized During Treatment: Gait belt Activity Tolerance: Patient limited by pain Patient left: in chair;with call bell/phone within reach;with chair alarm set Nurse Communication: Mobility status PT Visit Diagnosis: Other abnormalities of gait and mobility (R26.89);Other symptoms and signs involving the nervous system (R29.898);Pain     Time: [id number]    Charges:                       G Codes:       Olin HauserJenna Claud Gowan, SPT [phone]   Eileen StanfordJenna Jacci Ruberg 11/28/2016, 4:43 PM

## 2016-11-28 NOTE — Progress Notes (Signed)
PROGRESS NOTE    Alvino Lechuga  ZOX:[id number] DOB: 08-13-1934 DOA: 11/26/2016  PCP: Patient, No Pcp Per   Brief Narrative:  Pt is 82 yo male presented to [facility] with one month long hx of left knee pain, intermittent lower back pain.   Assessment & Plan:   Principal Problem:   ARF (acute renal failure) (HCC), hyponatremia  - appears to be pre renal - resolving with IVF - pt with good urine output - BMP In AM  Active Problems:   Type 2 diabetes mellitus with vascular disease (HCC) - continue home medical regimen    Essential hypertension - reasonable inpatient control - continue home medical regimen     Acute pain of left knee, ? Trochanteric bursitis around knee cap - knee exam stable this AM - attempt to mobilize this AM, NSAID's if tolerating   DVT prophylaxis: Lovenox SQ Code Status: Full  Family Communication: Patient at bedside  Disposition Plan: home in 1-2 days  Consultants:   Ortho  Procedures:   Left knee aspiration 6/19   Antimicrobials:   None  Subjective: Still with left knee pain but overall better.   Objective: Vitals:   11/27/16 1655 11/27/16 2105 11/28/16 0512 11/28/16 0955  BP: (!) 104/52 (!) 110/54 113/65 (!) 124/92  Pulse: 80 74 87 99  Resp: 17 17 17 17   Temp: 98.6 F (37 C) 97.9 F (36.6 C) 97.5 F (36.4 C) 98 F (36.7 C)  TempSrc: Oral Oral Oral Oral  SpO2: 99% 97% 100% 100%  Weight:  68.5 kg (151 lb 0.2 oz)    Height:        Intake/Output Summary (Last 24 hours) at 11/28/16 1233 Last data filed at 11/28/16 0953  Gross per 24 hour  Intake          3086.67 ml  Output             1825 ml  Net          1261.67 ml   Filed Weights   11/27/16 0058 11/27/16 1100 11/27/16 2105  Weight: 68.5 kg (151 lb 1.6 oz) 68.5 kg (151 lb 0.2 oz) 68.5 kg (151 lb 0.2 oz)    Examination:  General exam: Appears calm and comfortable  Respiratory system: Clear  to auscultation. Respiratory effort normal. Cardiovascular system: S1 & S2 heard, RRR. No JVD, rubs, gallops or clicks. No pedal edema. Gastrointestinal system: Abdomen is nondistended, soft and nontender. No organomegaly or masses felt. Normal bowel sounds heard. Central nervous system: Alert and oriented. No focal neurological deficits. Extremities: Symmetric 5 x 5 power. Skin: No rashes, lesions or ulcers Psychiatry: Judgement and insight appear normal. Mood & affect appropriate.   Data Reviewed: I have personally reviewed following labs and imaging studies  CBC:  Recent Labs Lab 11/26/16 1919 11/27/16 0729 11/28/16 0458  WBC 8.2 7.0 6.9  NEUTROABS 5.3  --   --   HGB 12.3* 10.9* 10.9*  HCT 36.4* 33.0* 33.5*  MCV 91.9 93.2 95.2  PLT 161 129* 140*   Basic Metabolic Panel:  Recent Labs Lab 11/26/16 1919 11/27/16 0729 11/28/16 0458  NA 129* 136 137  K 4.3 3.9 4.1  CL 100* 108 108  CO2 18* 20* 23  GLUCOSE 132* 74 95  BUN 66* 57* 37*  CREATININE 2.47* 2.13* 1.74*  CALCIUM 9.8 8.7* 8.5*   Cardiac Enzymes:  Recent Labs Lab 11/26/16 2255  CKTOTAL 62      Component Value Date/Time  COLORURINE YELLOW 11/27/2016 0134   APPEARANCEUR HAZY (A) 11/27/2016 0134   LABSPEC 1.012 11/27/2016 0134   PHURINE 5.0 11/27/2016 0134   GLUCOSEU NEGATIVE 11/27/2016 0134   HGBUR NEGATIVE 11/27/2016 0134   BILIRUBINUR NEGATIVE 11/27/2016 0134   KETONESUR NEGATIVE 11/27/2016 0134   PROTEINUR NEGATIVE 11/27/2016 0134   NITRITE NEGATIVE 11/27/2016 0134   LEUKOCYTESUR TRACE (A) 11/27/2016 0134   Radiology Studies: Mr Lumbar Spine Wo Contrast  Result Date: 11/28/2016 CLINICAL DATA:  Back pain left knee pain EXAM: MRI LUMBAR SPINE WITHOUT CONTRAST TECHNIQUE: Multiplanar, multisequence MR imaging of the lumbar spine was performed. No intravenous contrast was administered. COMPARISON:  None. FINDINGS: Segmentation:  Normal Alignment:  Normal Vertebrae:  Normal Conus medullaris: Extends to  the L1-2 level and appears normal. Paraspinal and other soft tissues: Negative for retroperitoneal mass or adenopathy Asymmetric edema and atrophy in the erector splenium muscles on the left. No fluid collection or mass. Disc levels: L1-2:  Negative L2-3: Diffuse bulging of the disc with bilateral facet hypertrophy. Mild spinal stenosis. L3-4: Left foraminal and extraforaminal disc protrusion with extruded disc fragment extending cranially and compressing left L3 nerve root in the foramen. Bilateral facet hypertrophy. Mild spinal stenosis. Subarticular stenosis on the left. L4-5: Disc degeneration and disc bulging. Small central disc protrusion. Diffuse endplate spurring. Bilateral facet hypertrophy. Mild spinal stenosis. Subarticular stenosis bilaterally L5-S1: Disc bulging and mild endplate spurring. No significant stenosis. IMPRESSION: Extruded disc fragment on the left at L3-4 extending into the foramen and compressing the left L3 nerve root. Mild spinal stenosis. Asymmetric atrophy and edema in the erector spinae muscle on the left which may be related to denervation atrophy. Mild spinal stenosis and subarticular stenosis bilaterally L4-5 Mild spinal stenosis L2-3 Electronically Signed   By: Marlan Palauharles  Clark M.D.   On: 11/28/2016 08:47   Mr Knee Left Wo Contrast  Result Date: 11/28/2016 CLINICAL DATA:  82 year old with progressive left knee pain and limited ambulation for 1 month. No acute injury. Pain is primarily at the patella. EXAM: MRI OF THE LEFT KNEE WITHOUT CONTRAST TECHNIQUE: Multiplanar, multisequence MR imaging of the knee was performed. No intravenous contrast was administered. COMPARISON:  Radiograph 11/18/2016. FINDINGS: MENISCI Medial meniscus:  Intact with normal morphology. Lateral meniscus:  Intact with normal morphology. LIGAMENTS Cruciates:  Intact. Collaterals:  Intact. CARTILAGE Patellofemoral:  Well preserved. Medial:  Well preserved. Lateral:  Well preserved. MISCELLANEOUS Joint:   Minimal joint fluid. Popliteal Fossa:  Unremarkable. No significant Baker's cyst. Extensor Mechanism: Intact. Minimal spurring at the quadriceps insertion on the patella. No surrounding soft tissue abnormality. Bones:  No acute or significant extra-articular osseous findings. Other: No significant periarticular soft tissue findings. IMPRESSION: Normal examination without acute findings or explanation for the patient's symptoms. Electronically Signed   By: Carey BullocksWilliam  Veazey M.D.   On: 11/28/2016 09:32    Scheduled Meds: . amLODipine  5 mg Oral Daily  . atorvastatin  40 mg Oral QPM  . clopidogrel  75 mg Oral Daily  . glimepiride  2 mg Oral QAC breakfast  . metoprolol tartrate  50 mg Oral BID  . omega-3 acid ethyl esters  2 g Oral BID  . tamsulosin  0.4 mg Oral Daily   Continuous Infusions:   LOS: 1 day   Time spent: 30 minutes   Debbora PrestoIskra Magick-Micky Sheller, MD [facility] Pager [phone]  If 7PM-7AM, please contact night-coverage [email or web address] Password [email or web address] 11/28/2016, 12:33 PM

## 2016-11-28 NOTE — Progress Notes (Signed)
Patient ID: [name], male   DOB: 1934-10-22, 82 y.o.   MRN: [id number]  MRI's reviewed, knee WNL but L-spine shows herniated disk at L3 with nerve root impingement on left c/w pt's symptoms. Recommend outpatient referral to neurosurgery or orthopedic spine specialist. Orthopedic surgery will sign off.    Freeman CaldronMichael J. Litsy Epting, PA-C Orthopedic Surgery [phone]

## 2016-11-28 NOTE — Care Management Note (Signed)
Case Management Note  Patient Details  Name: [name] MRN: [id number] Date of Birth: 1934-07-31  Subjective/Objective:                 Spoke to patient at the bedside. He states he comes from home. States he just received results from MRI that showed a pinched nerve. He states he will follow up with his doctor about this prior to getting OP PT.    Action/Plan:  No other CM needs identified at this time. Expected Discharge Date:  11/28/16               Expected Discharge Plan:  Home/Self Care  In-House Referral:     Discharge planning Services  CM Consult  Post Acute Care Choice:    Choice offered to:     DME Arranged:    DME Agency:     HH Arranged:    HH Agency:     Status of Service:  Completed, signed off  If discussed at [employer] Length of Stay Meetings, dates discussed:    Additional Comments:  Lawerance SabalDebbie Olon Russ, RN 11/28/2016, 1:29 PM

## 2016-11-29 LAB — CBC
HCT: 32.6 % — ABNORMAL LOW (ref 39.0–52.0)
HEMOGLOBIN: 10.6 g/dL — AB (ref 13.0–17.0)
MCH: 30.5 pg (ref 26.0–34.0)
MCHC: 32.5 g/dL (ref 30.0–36.0)
MCV: 93.9 fL (ref 78.0–100.0)
PLATELETS: 139 10*3/uL — AB (ref 150–400)
RBC: 3.47 MIL/uL — AB (ref 4.22–5.81)
RDW: 12.9 % (ref 11.5–15.5)
WBC: 7.4 10*3/uL (ref 4.0–10.5)

## 2016-11-29 LAB — BASIC METABOLIC PANEL
ANION GAP: 5 (ref 5–15)
BUN: 29 mg/dL — ABNORMAL HIGH (ref 6–20)
CO2: 23 mmol/L (ref 22–32)
Calcium: 8.5 mg/dL — ABNORMAL LOW (ref 8.9–10.3)
Chloride: 108 mmol/L (ref 101–111)
Creatinine, Ser: 1.52 mg/dL — ABNORMAL HIGH (ref 0.61–1.24)
GFR calc Af Amer: 48 mL/min — ABNORMAL LOW (ref >60)
GFR, EST NON AFRICAN AMERICAN: 42 mL/min — AB (ref >60)
Glucose, Bld: 103 mg/dL — ABNORMAL HIGH (ref 65–99)
POTASSIUM: 3.9 mmol/L (ref 3.5–5.1)
SODIUM: 136 mmol/L (ref 135–145)

## 2016-11-29 MED ORDER — METHYLPREDNISOLONE 4 MG PO TBPK
4.0000 mg | ORAL_TABLET | Freq: Three times a day (TID) | ORAL | Status: AC
  Administered 2016-11-30 (×3): 4 mg via ORAL

## 2016-11-29 MED ORDER — METHYLPREDNISOLONE 4 MG PO TBPK
8.0000 mg | ORAL_TABLET | Freq: Every morning | ORAL | Status: AC
  Administered 2016-11-29: 8 mg via ORAL
  Filled 2016-11-29: qty 21

## 2016-11-29 MED ORDER — METHYLPREDNISOLONE 4 MG PO TBPK
4.0000 mg | ORAL_TABLET | ORAL | Status: AC
  Administered 2016-11-29: 4 mg via ORAL

## 2016-11-29 MED ORDER — OXYCODONE HCL 5 MG PO TABS: 5.0000 mg | ORAL_TABLET | Freq: Four times a day (QID) | ORAL | 0 refills | Status: DC | PRN

## 2016-11-29 MED ORDER — METHYLPREDNISOLONE 4 MG PO TBPK: 8.0000 mg | ORAL_TABLET | Freq: Every evening | ORAL | Status: DC

## 2016-11-29 MED ORDER — METHYLPREDNISOLONE 4 MG PO TBPK: ORAL_TABLET | ORAL | 0 refills | Status: DC

## 2016-11-29 MED ORDER — METHYLPREDNISOLONE 4 MG PO TBPK: 4.0000 mg | ORAL_TABLET | Freq: Four times a day (QID) | ORAL | Status: DC

## 2016-11-29 MED ORDER — METHYLPREDNISOLONE 4 MG PO TBPK
8.0000 mg | ORAL_TABLET | Freq: Every evening | ORAL | Status: AC
  Administered 2016-11-29: 8 mg via ORAL

## 2016-11-29 NOTE — Discharge Summary (Addendum)
Physician Discharge Summary  [name] ZOX:[id number] DOB: 21-Oct-1934 DOA: 11/26/2016  PCP: Patient, No Pcp Per  Admit date: 11/26/2016 Discharge date: 11/29/2016  Recommendations for Outpatient Follow-up:  1. Pt will need to follow up with PCP in 2-3 weeks post discharge 2. Please obtain BMP to evaluate electrolytes and kidney function 3. Please also check CBC to evaluate Hg and Hct levels 4. Patient has an appointment scheduled with neurosurgeon as outlined below   Discharge Diagnoses:  Principal Problem:   ARF (acute renal failure) (HCC) Active Problems:   Type 2 diabetes mellitus with vascular disease (HCC)   Essential hypertension   Dehydration   Acute pain of left knee   Discharge Condition: Stable  Diet recommendation: Heart healthy diet discussed in details   Brief Narrative:  Pt is 82 yo male presented to [facility] with one month long hx of left knee pain, intermittent lower back pain.   Assessment & Plan:   Principal Problem:   ARF (acute renal failure) (HCC), hyponatremia  - pre renal in etiology - improving with IVF  - good urine output   Active Problems:   Type 2 diabetes mellitus with vascular disease (HCC) - resume home medical regimen     Essential hypertension - stable inpatient  - resume home medical regimen     Acute pain of left knee, ? Trochanteric bursitis around knee cap - MRI knee stable, lumbar spine MRI with herniated disc at L3  - d/w neurosurgeon Dr. Lovell Sheehan, recommended outpatient follow up - medrol pack to be started as well per Dr. Lovell Sheehan recommendations  - follow up scheduled, please see detail below - pt does not feel safe at home alone - PT eval requested for consideration of SNF placement   DVT prophylaxis: Lovenox SQ Code Status: Full  Family Communication: Patient at bedside  Disposition Plan: SNF if bed available   Consultants:   Ortho  Neurosurgery over the phone Dr. Lovell Sheehan   Procedures:   Left knee  aspiration 6/19   Procedures/Studies: Mr Lumbar Spine Wo Contrast  Result Date: 11/28/2016 CLINICAL DATA:  Back pain left knee pain EXAM: MRI LUMBAR SPINE WITHOUT CONTRAST TECHNIQUE: Multiplanar, multisequence MR imaging of the lumbar spine was performed. No intravenous contrast was administered. COMPARISON:  None. FINDINGS: Segmentation:  Normal Alignment:  Normal Vertebrae:  Normal Conus medullaris: Extends to the L1-2 level and appears normal. Paraspinal and other soft tissues: Negative for retroperitoneal mass or adenopathy Asymmetric edema and atrophy in the erector splenium muscles on the left. No fluid collection or mass. Disc levels: L1-2:  Negative L2-3: Diffuse bulging of the disc with bilateral facet hypertrophy. Mild spinal stenosis. L3-4: Left foraminal and extraforaminal disc protrusion with extruded disc fragment extending cranially and compressing left L3 nerve root in the foramen. Bilateral facet hypertrophy. Mild spinal stenosis. Subarticular stenosis on the left. L4-5: Disc degeneration and disc bulging. Small central disc protrusion. Diffuse endplate spurring. Bilateral facet hypertrophy. Mild spinal stenosis. Subarticular stenosis bilaterally L5-S1: Disc bulging and mild endplate spurring. No significant stenosis. IMPRESSION: Extruded disc fragment on the left at L3-4 extending into the foramen and compressing the left L3 nerve root. Mild spinal stenosis. Asymmetric atrophy and edema in the erector spinae muscle on the left which may be related to denervation atrophy. Mild spinal stenosis and subarticular stenosis bilaterally L4-5 Mild spinal stenosis L2-3 Electronically Signed   By: Marlan Palau M.D.   On: 11/28/2016 08:47   Mr Knee Left Wo Contrast  Result Date: 11/28/2016 CLINICAL  DATA:  82 year old with progressive left knee pain and limited ambulation for 1 month. No acute injury. Pain is primarily at the patella. EXAM: MRI OF THE LEFT KNEE WITHOUT CONTRAST TECHNIQUE:  Multiplanar, multisequence MR imaging of the knee was performed. No intravenous contrast was administered. COMPARISON:  Radiograph 11/18/2016. FINDINGS: MENISCI Medial meniscus:  Intact with normal morphology. Lateral meniscus:  Intact with normal morphology. LIGAMENTS Cruciates:  Intact. Collaterals:  Intact. CARTILAGE Patellofemoral:  Well preserved. Medial:  Well preserved. Lateral:  Well preserved. MISCELLANEOUS Joint:  Minimal joint fluid. Popliteal Fossa:  Unremarkable. No significant Baker's cyst. Extensor Mechanism: Intact. Minimal spurring at the quadriceps insertion on the patella. No surrounding soft tissue abnormality. Bones:  No acute or significant extra-articular osseous findings. Other: No significant periarticular soft tissue findings. IMPRESSION: Normal examination without acute findings or explanation for the patient's symptoms. Electronically Signed   By: Carey Bullocks M.D.   On: 11/28/2016 09:32   Dg Knee Complete 4 Views Left  Result Date: 11/18/2016 CLINICAL DATA:  Pain EXAM: LEFT KNEE - COMPLETE 4+ VIEW COMPARISON:  None. FINDINGS: Frontal, lateral, and bilateral oblique views were obtained. There is no fracture or dislocation. No joint effusion. The joint spaces are normal. There is slight intracondylar spurring. There are multiple foci of arterial vascular calcification. IMPRESSION: Slight intracondylar spurring. No fracture or joint space narrowing. No joint effusion. Extensive atherosclerosis involving multiple lower extremity arterial vessels. Electronically Signed   By: Bretta Bang III M.D.   On: 11/18/2016 15:47   Discharge Exam: Vitals:   11/29/16 0443 11/29/16 0947  BP: 117/65 (!) 117/58  Pulse: 67 90  Resp: 16 16  Temp: 98.9 F (37.2 C) 97.6 F (36.4 C)   Vitals:   11/28/16 2050 11/28/16 2254 11/29/16 0443 11/29/16 0947  BP: (!) 102/51 115/65 117/65 (!) 117/58  Pulse: 85 79 67 90  Resp: 18  16 16   Temp: 97.7 F (36.5 C)  98.9 F (37.2 C) 97.6 F (36.4  C)  TempSrc: Oral  Oral Oral  SpO2: 100%  100% 100%  Weight: 68.4 kg (150 lb 12.8 oz)     Height:        General: Pt is alert, follows commands appropriately, not in acute distress Cardiovascular: Regular rate and rhythm, S1/S2 +, no murmurs, no rubs, no gallops Respiratory: Clear to auscultation bilaterally, no wheezing, no crackles, no rhonchi Abdominal: Soft, non tender, non distended, bowel sounds +, no guarding Extremities: no edema, no cyanosis, pulses palpable bilaterally DP and PT Neuro: Grossly nonfocal  Discharge Instructions  Discharge Instructions    Diet - low sodium heart healthy    Complete by:  As directed    Increase activity slowly    Complete by:  As directed      Allergies as of 11/29/2016   No Known Allergies     Medication List    TAKE these medications   amLODipine 5 MG tablet Commonly known as:  NORVASC Take 5 mg by mouth daily.   atorvastatin 40 MG tablet Commonly known as:  LIPITOR Take 40 mg by mouth every evening.   clopidogrel 75 MG tablet Commonly known as:  PLAVIX Take 75 mg by mouth daily.   glimepiride 2 MG tablet Commonly known as:  AMARYL Take 2 mg by mouth daily.   hydrochlorothiazide 12.5 MG tablet Commonly known as:  HYDRODIURIL Take 12.5 mg by mouth daily.   metFORMIN 1000 MG tablet Commonly known as:  GLUCOPHAGE Take 1,000 mg by mouth 2 (two)  times daily.   methylPREDNISolone 4 MG Tbpk tablet Commonly known as:  MEDROL DOSEPAK Take as prescribed   metoprolol tartrate 50 MG tablet Commonly known as:  LOPRESSOR Take 50 mg by mouth 2 (two) times daily.   nitroGLYCERIN 0.4 MG SL tablet Commonly known as:  NITROSTAT Place 0.4 mg under the tongue every 5 (five) minutes as needed for chest pain.   omega-3 acid ethyl esters 1 g capsule Commonly known as:  LOVAZA Take 2 g by mouth 2 (two) times daily.   oxyCODONE 5 MG immediate release tablet Commonly known as:  Oxy IR/ROXICODONE Take 1 tablet (5 mg total) by mouth  every 6 (six) hours as needed for severe pain.   tamsulosin 0.4 MG Caps capsule Commonly known as:  FLOMAX Take 0.4 mg by mouth daily.   traMADol 50 MG tablet Commonly known as:  ULTRAM Take 50 mg by mouth 3 (three) times daily as needed for moderate pain.      Follow-up Information    Tressie StalkerJenkins, Jeffrey, MD Follow up on 12/07/2016.   Specialty:  Neurosurgery Why:  Please arrive at 11 am on June 29th, 2018 Contact information: [address] [place] [address] [phone]        Dorothea OgleMyers, Decorey Wahlert M, MD Follow up.   Specialty:  Internal Medicine Why:  please call me with any questions [phone] Contact information: [address] [place] [address] [phone]            The results of significant diagnostics from this hospitalization (including imaging, microbiology, ancillary and laboratory) are listed below for reference.     Microbiology: No results found for this or any previous visit (from the past 240 hour(s)).   Labs: Basic Metabolic Panel:  Recent Labs Lab 11/26/16 1919 11/27/16 0729 11/28/16 0458 11/29/16 0448  NA 129* 136 137 136  K 4.3 3.9 4.1 3.9  CL 100* 108 108 108  CO2 18* 20* 23 23  GLUCOSE 132* 74 95 103*  BUN 66* 57* 37* 29*  CREATININE 2.47* 2.13* 1.74* 1.52*  CALCIUM 9.8 8.7* 8.5* 8.5*   CBC:  Recent Labs Lab 11/26/16 1919 11/27/16 0729 11/28/16 0458 11/29/16 0448  WBC 8.2 7.0 6.9 7.4  NEUTROABS 5.3  --   --   --   HGB 12.3* 10.9* 10.9* 10.6*  HCT 36.4* 33.0* 33.5* 32.6*  MCV 91.9 93.2 95.2 93.9  PLT 161 129* 140* 139*   Cardiac Enzymes:  Recent Labs Lab 11/26/16 2255  CKTOTAL 62    SIGNED: Time coordinating discharge: 45 minutes  Debbora PrestoIskra Magick-Glynn Freas, MD  [facility] 11/29/2016, 11:13 AM Pager [phone]  If 7PM-7AM, please contact night-coverage [email or web address] Password [email or web address]

## 2016-11-29 NOTE — Consult Note (Signed)
          [facility] Primary [facility] Navigator  11/29/2016  Joel CyphersWilliam Lucas [birth year] [id number]  Went to seepatientat the bedside to identify possible discharge needs.  Patient reports having worsening left knee pain that "gives way" at times that had led to this admission. [name].   Patient shared [facility] [place] to [place] without difficulty.   Patient reports managing his own medications at home straight out of the containers using his own organizing system.   Patient states that he drives to hisdoctors'appointments prior to admission and hopes to do the same when he is discharged. He mentioned having a brother [name] and sister in-law [name] but may not be able to help with transport since brother is providing care to his wife. [facility] list [facility] resources provided for himto use when needed. He is aware to call [facility] CM if he has further transportation needs.   Per RN report, patient mentioned that he does not feel like he can safely take care of himself at home alone. He states he has siblings that live 7 miles away from him and will ask them if they can help him.  MD was notified and discharge was cancelled. Awaiting for PT to re-evaluate patient for possible placement.  Patient was also provided the [facility] list of personal care services as afuture resource when needed, with the understanding that hewill pay out of the pocket for it.Patient wasthankful for the resource lists provided to him.  Anticipated discharge plan is pending per PT recommendation and physician order.   Patient expressedunderstanding to call primary care provider's officewhen he returns back home,for a post discharge follow-up appointment within a week or sooner if needed.Patient letter (with PCP's contact number) was provided as his reminder.  Explained to  patient about further [facility] CM services available for healthmanagement and he verbalized that DM is "pretty much under control with diet, medications and exercise" with no problem so far. Primary care provider is helping him manage and monitors A1c every 6 months as stated. Patient voiced understanding to requestreferral to [facility] care managementservicesfrom primary care provider if deemed necessary for it.    [facility] care management contact information provided for future needs that may arise.    For questions, please contact:  Joel HasteLorraine Kena Lucas, BSN, RN- [facility] Primary Care Navigator  Telephone: [phone] [facility]

## 2016-11-29 NOTE — Discharge Instructions (Signed)
Herniated Disk A herniated disk, also called a ruptured disk or slipped disk, occurs when a disk in the spine bulges out too far. Between the bones in the spine (vertebrae), there are oval disks that are made of a soft, spongy center that is surrounded by a tough outer ring. The disks connect your vertebrae, help your spine move, and absorb shocks from your movement. When you have a herniated disk, the spongy center of the disk bulges out or breaks through the outer ring. It can press on a nerve between the vertebrae and cause pain. This can occur anywhere in the back or neck area, but the lower back is most commonly affected. What are the causes? This condition may be caused by:  Age-related wear and tear. The spongy centers of spinal disks tend to shrink and dry out with age, which makes them more likely to herniate.  Sudden injury, such as a strain or sprain.  What increases the risk? Aging is the main risk factor for a herniated disk. Other risk factors include:  Being a man who is 30-50 years old.  Frequently doing activities that involve heavy lifting, bending, or twisting.  Frequently driving for long hours at a time.  Not getting enough exercise.  Being overweight.  Smoking.  Having a family history of back problems or herniated disks.  Being pregnant or giving birth.  Having poor nutrition.  Being tall.  What are the signs or symptoms? Symptoms may vary depending on where your herniated disk is located.  A herniated disk in the lower back may cause sharp pain in: ? Part of the arm, leg, hip, or buttocks. ? The back of the lower leg (calf). ? The lower back, spreading down through the leg into the foot (sciatica).  A herniated disk in the neck may cause dizziness and vertigo. It may also cause pain or weakness in: ? The neck. ? The shoulder blades. ? Upper arm, forearm, or fingers.  You may also have muscle weakness. It may be difficult to: ? Lift your leg or  arm. ? Stand on your toes. ? Squeeze tightly with one of your hands.  Other symptoms may include: ? Numbness or tingling in the affected areas of the hands, arms, feet, or legs. ? Inability to control when you urinate or when you have bowel movements. This is a rare but serious sign of a severe herniated disk in the lower back.  How is this diagnosed? This condition may be diagnosed based on:  Your symptoms.  Your medical history.  A physical exam. The exam may include: ? Straight-leg test. You will lie on your back while your health care provider lifts your leg, keeping your knee straight. If you feel pain, you likely have a herniated disk. ? Neurological tests. This includes checking for numbness, reflexes, muscle strength, and posture.  Imaging tests, such as: ? X-rays. ? MRI. ? CT scan. ? Electromyogram (EMG) to check the nerves that control muscles. This test may be used to determine which nerves are affected by your herniated disk.  How is this treated? Treatment for this condition may include:  A short period of rest. This is usually the first treatment. ? You may be on bed rest for up to 2 days, or you may be instructed to stay home and avoid physical activity. ? If you have a herniated disk in your lower back, avoid sitting as much as possible. Sitting increases pressure on the disk.  Medicines. These may   include: ? NSAIDs to help reduce pain and swelling. ? Muscle relaxants to prevent sudden tightening of the back muscles (back spasms). ? Prescription pain medicines, if you have severe pain.  Steroid injections in the area of the herniated disk. This can help reduce pain and swelling.  Physical therapy to strengthen your back muscles.  In many cases, symptoms go away with treatment over a period of days or weeks. You will most likely be free of symptoms after 3-4 months. If other treatments do not help to relieve your symptoms, you may need surgery. Follow these  instructions at home: Medicines  Take over-the-counter and prescription medicines only as told by your health care provider.  Do not drive or use heavy machinery while taking prescription pain medicine. Activity  Rest as directed.  After your rest period: ? Return to your normal activities and gradually begin exercising as told by your health care provider. Ask your health care provider what activities and exercises are safe for you. ? Use good posture. ? Avoid movements that cause pain. ? Do not lift anything that is heavier than 10 lb (4.5 kg) until your health care provider says this is safe. ? Do not sit or stand for long periods of time without changing positions. ? Do not sit for long periods of time without getting up and moving around.  If physical therapy was prescribed, do exercises as instructed.  Aim to strengthen muscles in your back and abdomen with exercises like crunches, swimming, or walking. General instructions  Do not use any products that contain nicotine or tobacco, such as cigarettes and e-cigarettes. These products can delay healing. If you need help quitting, ask your health care provider.  Do not wear high-heeled shoes.  Do not sleep on your belly.  If you are overweight, work with your health care provider to lose weight safely.  To prevent or treat constipation while you are taking prescription pain medicine, your health care provider may recommend that you: ? Drink enough fluid to keep your urine clear or pale yellow. ? Take over-the-counter or prescription medicines. ? Eat foods that are high in fiber, such as fresh fruits and vegetables, whole grains, and beans. ? Limit foods that are high in fat and processed sugars, such as fried and sweet foods.  Keep all follow-up visits as told by your health care provider. This is important. How is this prevented?  Maintain a healthy weight.  Try to avoid stressful situations.  Maintain physical  fitness. Do at least 150 minutes of moderate-intensity exercise each week, such as brisk walking or water aerobics.  When lifting objects: ? Keep your feet at least shoulder-width apart and tighten your abdominal muscles. ? Keep your spine neutral as you bend your knees and hips. It is important to lift using the strength of your legs, not your back. Do not lock your knees straight out. ? Always ask for help to lift heavy or awkward objects. Contact a health care provider if:  You have back pain or neck pain that does not get better after 6 weeks.  You have severe pain in your back, neck, legs, or arms.  You develop numbness, tingling, or weakness in any part of your body. Get help right away if:  You cannot move your arms or legs.  You cannot control when you urinate or have bowel movements.  You feel dizzy or you faint.  You have shortness of breath. This information is not intended to replace advice given   to you by your health care provider. Make sure you discuss any questions you have with your health care provider. Document Released: 05/25/2000 Document Revised: 01/23/2016 Document Reviewed: 01/23/2016 Elsevier Interactive Patient Education  2017 [employer]  

## 2016-11-29 NOTE — Progress Notes (Signed)
Pt discharge canceled.  Paged physical therapy.

## 2016-11-29 NOTE — Progress Notes (Signed)
Physical Therapy Treatment Patient Details Name: [name] MRN: [id number] DOB: [birth year] Today's Date: 11/29/2016    History of Present Illness Pt is an 82 yo M suffering from L knee pain and acute renal failure on chronic kidney disease. Pt states that the pain originated in his back with no MOI and radiated down into his lateral thigh and eventually his knee. The back pain resolved, but his knee pain did not. Pt received a shot for the pain, but it did not relieve his symptoms. Pt was living at home and mod I with all ADLs prior to admission.    PT Comments    Pt continues to be limited by pain with short distance gait and mobility. Pt will benefit from continued therapy follow-up at discharge. Improved sequencing with RW noted this session and pt advised to use RW once he returns home in order to improve his mobility and reduce falls risks.     Follow Up Recommendations  Outpatient PT     Equipment Recommendations  None recommended by PT    Recommendations for Other Services       Precautions / Restrictions Precautions Precautions: None Restrictions Weight Bearing Restrictions: No    Mobility  Bed Mobility               General bed mobility comments: sitting up in recliner when PT arrives  Transfers Overall transfer level: Needs assistance Equipment used: Rolling walker (2 wheeled) Transfers: Sit to/from Stand Sit to Stand: Supervision         General transfer comment: supervision for safety from recliner to RW  Ambulation/Gait Ambulation/Gait assistance: Min guard Ambulation Distance (Feet): 75 Feet Assistive device: Rolling walker (2 wheeled) Gait Pattern/deviations: Decreased stance time - left;Decreased step length - right;Antalgic;Decreased weight shift to left Gait velocity: decreased Gait velocity interpretation: Below normal speed for age/gender General Gait Details: cues for sequencing with RW given including leading with RLE (painful LE). Pt  is reports no change in pain throughout session   Stairs            Wheelchair Mobility    Modified Rankin (Stroke Patients Only)       Balance Overall balance assessment: Needs assistance Sitting-balance support: No upper extremity supported;Feet supported Sitting balance-Leahy Scale: Fair     Standing balance support: Bilateral upper extremity supported;During functional activity Standing balance-Leahy Scale: Poor                              Cognition Arousal/Alertness: Awake/alert Behavior During Therapy: WFL for tasks assessed/performed Overall Cognitive Status: Within Functional Limits for tasks assessed                                        Exercises      General Comments        Pertinent Vitals/Pain Pain Assessment: 0-10 Pain Score: 7  Pain Location: L knee Pain Descriptors / Indicators: Dull;Sharp Pain Intervention(s): Monitored during session;Premedicated before session;Repositioned    Home Living                      Prior Function            PT Goals (current goals can now be found in the care plan section) Acute Rehab PT Goals Patient Stated Goal: to decrease pain Progress towards PT goals:  Progressing toward goals    Frequency    Min 3X/week      PT Plan Current plan remains appropriate    Co-evaluation              AM-PAC PT "6 Clicks" Daily Activity  Outcome Measure  Difficulty turning over in bed (including adjusting bedclothes, sheets and blankets)?: None Difficulty moving from lying on back to sitting on the side of the bed? : None Difficulty sitting down on and standing up from a chair with arms (e.g., wheelchair, bedside commode, etc,.)?: A Little Help needed moving to and from a bed to chair (including a wheelchair)?: A Little Help needed walking in hospital room?: A Little Help needed climbing 3-5 steps with a railing? : A Lot 6 Click Score: 19    End of Session  Equipment Utilized During Treatment: Gait belt Activity Tolerance: Patient limited by pain Patient left: in chair;with call bell/phone within reach;with chair alarm set Nurse Communication: Mobility status PT Visit Diagnosis: Other abnormalities of gait and mobility (R26.89);Other symptoms and signs involving the nervous system (R29.898);Pain Pain - Right/Left: Left Pain - part of body: Knee     Time: 1610-9604 PT Time Calculation (min) (ACUTE ONLY): 13 min  Charges:  $Gait Training: 8-22 mins                    G Codes:       [name] PT, DPT  [phone]    [name] [name] 11/29/2016, 4:11 PM

## 2016-11-29 NOTE — Progress Notes (Signed)
Paged Dr. Izola PriceMyers, pt states he does not feel like he can safely take care of himself at home alone.  Pt has siblings that live 7 miles away form him.  He is going to ask them if they can help him.

## 2016-11-30 DIAGNOSIS — M5126 Other intervertebral disc displacement, lumbar region: Secondary | ICD-10-CM | POA: Diagnosis not present

## 2016-11-30 DIAGNOSIS — N189 Chronic kidney disease, unspecified: Secondary | ICD-10-CM | POA: Diagnosis not present

## 2016-11-30 DIAGNOSIS — R278 Other lack of coordination: Secondary | ICD-10-CM | POA: Diagnosis not present

## 2016-11-30 DIAGNOSIS — M5187 Other intervertebral disc disorders, lumbosacral region: Secondary | ICD-10-CM | POA: Diagnosis not present

## 2016-11-30 DIAGNOSIS — E1159 Type 2 diabetes mellitus with other circulatory complications: Secondary | ICD-10-CM | POA: Diagnosis not present

## 2016-11-30 DIAGNOSIS — R41841 Cognitive communication deficit: Secondary | ICD-10-CM | POA: Diagnosis not present

## 2016-11-30 DIAGNOSIS — I2510 Atherosclerotic heart disease of native coronary artery without angina pectoris: Secondary | ICD-10-CM | POA: Diagnosis not present

## 2016-11-30 DIAGNOSIS — I10 Essential (primary) hypertension: Secondary | ICD-10-CM | POA: Diagnosis not present

## 2016-11-30 DIAGNOSIS — E1151 Type 2 diabetes mellitus with diabetic peripheral angiopathy without gangrene: Secondary | ICD-10-CM | POA: Diagnosis not present

## 2016-11-30 DIAGNOSIS — M545 Low back pain: Secondary | ICD-10-CM | POA: Diagnosis not present

## 2016-11-30 DIAGNOSIS — M6281 Muscle weakness (generalized): Secondary | ICD-10-CM | POA: Diagnosis not present

## 2016-11-30 DIAGNOSIS — R2681 Unsteadiness on feet: Secondary | ICD-10-CM | POA: Diagnosis not present

## 2016-11-30 DIAGNOSIS — E860 Dehydration: Secondary | ICD-10-CM | POA: Diagnosis not present

## 2016-11-30 DIAGNOSIS — D6489 Other specified anemias: Secondary | ICD-10-CM | POA: Diagnosis not present

## 2016-11-30 DIAGNOSIS — M25562 Pain in left knee: Secondary | ICD-10-CM | POA: Diagnosis not present

## 2016-11-30 DIAGNOSIS — M25559 Pain in unspecified hip: Secondary | ICD-10-CM | POA: Diagnosis not present

## 2016-11-30 DIAGNOSIS — N179 Acute kidney failure, unspecified: Secondary | ICD-10-CM | POA: Diagnosis not present

## 2016-11-30 DIAGNOSIS — M5416 Radiculopathy, lumbar region: Secondary | ICD-10-CM | POA: Diagnosis not present

## 2016-11-30 MED ORDER — OXYCODONE HCL 5 MG PO TABS: 5.0000 mg | ORAL_TABLET | Freq: Four times a day (QID) | ORAL | 0 refills | Status: AC | PRN

## 2016-11-30 MED ORDER — SENNOSIDES-DOCUSATE SODIUM 8.6-50 MG PO TABS: 1.0000 | ORAL_TABLET | Freq: Every evening | ORAL | Status: AC | PRN

## 2016-11-30 MED ORDER — TRAMADOL HCL 50 MG PO TABS: 50.0000 mg | ORAL_TABLET | Freq: Three times a day (TID) | ORAL | 0 refills | Status: AC | PRN

## 2016-11-30 MED ORDER — SENNOSIDES-DOCUSATE SODIUM 8.6-50 MG PO TABS
1.0000 | ORAL_TABLET | Freq: Two times a day (BID) | ORAL | Status: DC
  Administered 2016-11-30: 1 via ORAL
  Filled 2016-11-30: qty 1

## 2016-11-30 MED ORDER — METHYLPREDNISOLONE 4 MG PO TBPK: ORAL_TABLET | ORAL | 0 refills | Status: DC

## 2016-11-30 NOTE — Progress Notes (Signed)
Pending discharge, ? Need for SNF.  Joel PrestoMAGICK-Vanellope Passmore, MD  [facility] Pager [phone]  If 7PM-7AM, please contact night-coverage [email or web address] Password [email or web address]

## 2016-11-30 NOTE — Progress Notes (Signed)
Physical Therapy Treatment Patient Details Name: [name] MRN: [id number] DOB: [birth year] Today's Date: 11/30/2016    History of Present Illness Pt is an 82 yo M suffering from L knee pain and acute renal failure on chronic kidney disease. Pt states that the pain originated in his back with no MOI and radiated down into his lateral thigh and eventually his knee. The back pain resolved, but his knee pain did not. Pt received a shot for the pain, but it did not relieve his symptoms. Pt recently diagnosed with disc injury since admission via MRI. Pt was living at home and mod I with all ADLs prior to admission.    PT Comments    Pt continues to be limited with mobility this session due to pain in left knee following recent diagnosis and disc injury. Pt lives alone with a small dog and will not have 24hr assistance once he does return home. Due to pt's current mobility and lack of assistance at home, pt will benefit from short term rehab at a SNF in order to maximize his independence. Pt is agreeable to this plan himself as he doesn't not feel safe returning home. Pt continue to benefit from acute follow-up while admitted.     Follow Up Recommendations  SNF     Equipment Recommendations  None recommended by PT    Recommendations for Other Services       Precautions / Restrictions Precautions Precautions: Fall Restrictions Weight Bearing Restrictions: No    Mobility  Bed Mobility Overal bed mobility: Modified Independent             General bed mobility comments: sitting up in recliner when PT arrives  Transfers Overall transfer level: Needs assistance Equipment used: Rolling walker (2 wheeled) Transfers: Sit to/from Stand Sit to Stand: Min guard         General transfer comment: Min guard for safety from recliner  Ambulation/Gait Ambulation/Gait assistance: Min guard Ambulation Distance (Feet): 50 Feet (50x2) Assistive device: Rolling walker (2 wheeled) Gait  Pattern/deviations: Decreased stance time - left;Decreased step length - right;Antalgic;Decreased weight shift to left Gait velocity: decreased Gait velocity interpretation: <1.8 ft/sec, indicative of risk for recurrent falls General Gait Details: pt continues to have slow, steady gait with step-to sequencing. Pain is decreased this session with gait as compared to previous.    Stairs            Wheelchair Mobility    Modified Rankin (Stroke Patients Only)       Balance Overall balance assessment: Needs assistance Sitting-balance support: No upper extremity supported;Feet supported Sitting balance-Leahy Scale: Fair     Standing balance support: Bilateral upper extremity supported;During functional activity Standing balance-Leahy Scale: Poor Standing balance comment: reliant on RW for stability in standing                            Cognition Arousal/Alertness: Awake/alert Behavior During Therapy: WFL for tasks assessed/performed Overall Cognitive Status: Within Functional Limits for tasks assessed                                        Exercises      General Comments General comments (skin integrity, edema, etc.): Pt will be returning home ALONE and will not have any help if he does go home. pt woud like to go to SNF at discharge.  Pertinent Vitals/Pain Pain Assessment: 0-10 Pain Score: 8  Pain Location: L knee after gait Pain Descriptors / Indicators: Dull;Sharp Pain Intervention(s): Monitored during session;Premedicated before session;Repositioned    Home Living                      Prior Function            PT Goals (current goals can now be found in the care plan section) Acute Rehab PT Goals Patient Stated Goal: to go to rehab and then home PT Goal Formulation: With patient Time For Goal Achievement: 12/07/16 Potential to Achieve Goals: Good Progress towards PT goals: Progressing toward goals     Frequency    Min 3X/week      PT Plan Discharge plan needs to be updated    Co-evaluation              AM-PAC PT "6 Clicks" Daily Activity  Outcome Measure  Difficulty turning over in bed (including adjusting bedclothes, sheets and blankets)?: None Difficulty moving from lying on back to sitting on the side of the bed? : None Difficulty sitting down on and standing up from a chair with arms (e.g., wheelchair, bedside commode, etc,.)?: Total Help needed moving to and from a bed to chair (including a wheelchair)?: A Little Help needed walking in hospital room?: A Little Help needed climbing 3-5 steps with a railing? : A Lot 6 Click Score: 17    End of Session Equipment Utilized During Treatment: Gait belt Activity Tolerance: Patient tolerated treatment well Patient left: in chair;with call bell/phone within reach;with chair alarm set Nurse Communication: Mobility status PT Visit Diagnosis: Other abnormalities of gait and mobility (R26.89);Other symptoms and signs involving the nervous system (R29.898);Pain Pain - Right/Left: Left Pain - part of body: Knee     Time: [id number] PT Time Calculation (min) (ACUTE ONLY): 14 min  Charges:  $Gait Training: 8-22 mins                    G Codes:       Colin BroachSabra M. Kiley Solimine PT, DPT  [phone]    Ruel FavorsSabra Aletha HalimMarie Patryk Conant 11/30/2016, 4:00 PM

## 2016-11-30 NOTE — [place] FL2 (Signed)
[place] MEDICAID FL2 LEVEL OF CARE SCREENING TOOL     IDENTIFICATION  Patient Name: [name] Birthdate: 11-Nov-1934 Sex: male Admission Date (Current Location): 11/26/2016  [facility] and [place] Number:  Producer, television/film/video and Address:  [facility], [address], [place], [place] [address]      Provider Number: [id number]  Attending Physician Name and Address:  Dorothea Ogle, MD  Relative Name and Phone Number:  Ryshawn, [place], [phone]     Current Level of Care: Hospital Recommended Level of Care: Skilled Nursing Facility Prior Approval Number:    Date Approved/Denied:   PASRR Number: [id number] (Eff. 11/30/16)  Discharge Plan: SNF    Current Diagnoses: Patient Active Problem List   Diagnosis Date Noted  . Dehydration 11/27/2016  . Acute pain of left knee 11/27/2016  . ARF (acute renal failure) (HCC) 11/26/2016  . Type 2 diabetes mellitus with vascular disease (HCC) 10/25/2007  . HYPERLIPIDEMIA 10/25/2007  . Essential hypertension 10/25/2007  . MI 10/25/2007  . CAD (coronary artery disease) 10/25/2007    Orientation RESPIRATION BLADDER Height & Weight     Self, Time, Situation  Normal Continent Weight: 152 lb (68.9 kg) Height:  5\' 8"  (172.7 cm)  BEHAVIORAL SYMPTOMS/MOOD NEUROLOGICAL BOWEL NUTRITION STATUS      Continent Diet (Low sodium - Heart healthy)  AMBULATORY STATUS COMMUNICATION OF NEEDS Skin   Limited Assist (Min assist per PT) Verbally Normal                       Personal Care Assistance Level of Assistance  Bathing, Feeding, Dressing Bathing Assistance: Limited assistance Feeding assistance: Independent Dressing Assistance: Limited assistance     Functional Limitations Info  Sight, Hearing, Speech Sight Info: Impaired Hearing Info: Adequate Speech Info: Adequate    SPECIAL CARE FACTORS FREQUENCY  PT (By licensed PT)     PT Frequency: Evaluated 6/19 and a minimum mof 3X per week therapy  recommended              Contractures Contractures Info: Not present    Additional Factors Info  Code Status, Allergies Code Status Info: Full Allergies Info: No known allergies           Current Medications (11/30/2016):  This is the current hospital active medication list Current Facility-Administered Medications  Medication Dose Route Frequency Provider Last Rate Last Dose  . acetaminophen (TYLENOL) tablet 650 mg  650 mg Oral Q6H PRN Eduard Clos, MD       Or  . acetaminophen (TYLENOL) suppository 650 mg  650 mg Rectal Q6H PRN Eduard Clos, MD      . amLODipine (NORVASC) tablet 5 mg  5 mg Oral Daily Eduard Clos, MD   Stopped at 11/30/16 1200  . atorvastatin (LIPITOR) tablet 40 mg  40 mg Oral QPM Eduard Clos, MD   40 mg at 11/29/16 1748  . clopidogrel (PLAVIX) tablet 75 mg  75 mg Oral Daily Eduard Clos, MD   75 mg at 11/30/16 1010  . glimepiride (AMARYL) tablet 2 mg  2 mg Oral QAC breakfast Eduard Clos, MD   2 mg at 11/30/16 0800  . methylPREDNISolone (MEDROL DOSEPAK) tablet 4 mg  4 mg Oral 3 x daily with food Dorothea Ogle, MD   4 mg at 11/30/16 0800  . [START ON 12/01/2016] methylPREDNISolone (MEDROL DOSEPAK) tablet 4 mg  4 mg Oral 4X daily taper Danie Binder  M, MD      . methylPREDNISolone (MEDROL DOSEPAK) tablet 8 mg  8 mg Oral Nightly Dorothea OgleMyers, Iskra M, MD      . metoprolol tartrate (LOPRESSOR) tablet 50 mg  50 mg Oral BID Eduard ClosKakrakandy, Arshad N, MD   50 mg at 11/30/16 1056  . nitroGLYCERIN (NITROSTAT) SL tablet 0.4 mg  0.4 mg Sublingual Q5 min PRN Eduard ClosKakrakandy, Arshad N, MD      . omega-3 acid ethyl esters (LOVAZA) capsule 2 g  2 g Oral BID Eduard ClosKakrakandy, Arshad N, MD   2 g at 11/30/16 1010  . ondansetron (ZOFRAN) tablet 4 mg  4 mg Oral Q6H PRN Eduard ClosKakrakandy, Arshad N, MD       Or  . ondansetron [facility](ZOFRAN) injection 4 mg  4 mg Intravenous Q6H PRN Eduard ClosKakrakandy, Arshad N, MD      . oxyCODONE (Oxy IR/ROXICODONE) immediate release tablet 5  mg  5 mg Oral Q6H PRN Eduard ClosKakrakandy, Arshad N, MD      . senna-docusate (Senokot-S) tablet 1 tablet  1 tablet Oral BID Dorothea OgleMyers, Iskra M, MD   1 tablet at 11/30/16 1030  . tamsulosin (FLOMAX) capsule 0.4 mg  0.4 mg Oral Daily Eduard ClosKakrakandy, Arshad N, MD   0.4 mg at 11/30/16 1010  . traMADol (ULTRAM) tablet 50 mg  50 mg Oral TID PRN Eduard ClosKakrakandy, Arshad N, MD   50 mg at 11/27/16 1037     Discharge Medications: Please see discharge summary for a list of discharge medications.  Relevant Imaging Results:  Relevant Lab Results:   Additional Information ss#[id number]  Cristobal GoldmannCrawford, Jatavian Calica Bradley, LCSW

## 2016-11-30 NOTE — Clinical Social Work Placement (Signed)
   CLINICAL SOCIAL WORK PLACEMENT  NOTE  Date:  11/30/2016  Patient Details  Name: Elder CyphersWilliam Vanalstine MRN: [id number] Date of Birth: 06-30-1934  Clinical Social Work is seeking post-discharge placement for this patient at the Skilled  Nursing Facility level of care (*CSW will initial, date and re-position this form in  chart as items are completed):  No (Patient provided CSW with facility preference)   [facility]/family provided with [facility] Clinical [facility] Work Department's list of facilities offering this level of care within the geographic area requested by the patient (or if unable, by the patient's family).  Yes   Patient/family informed of their freedom to choose among providers that offer the needed level of care, that participate in Medicare, Medicaid or managed care program needed by the patient, have an available bed and are willing to accept the patient.  No   Patient/family informed of [facility]'s ownership interest in [facility] and [facility], as well as of the fact that they are under no obligation to receive care at these facilities.  PASRR submitted to EDS on 11/30/16     PASRR number received on 11/30/16     Existing PASRR number confirmed on       FL2 transmitted to all facilities in geographic area requested by pt/family on 11/30/16     FL2 transmitted to all facilities within larger geographic area on       Patient informed that his/her managed care company has contracts with or will negotiate with certain facilities, including the following:            Patient/family informed of bed offers received.  Patient chooses bed at       Physician recommends and patient chooses bed at      Patient to be transferred to   on  .  Patient to be transferred to facility by       Patient family notified on   of transfer.  Name of family member notified:        PHYSICIAN       Additional Comment:     _______________________________________________ Cristobal Goldmannrawford, Donterius Filley Bradley, LCSW 11/30/2016, 1:20 PM

## 2016-11-30 NOTE — Discharge Summary (Signed)
Physician Discharge Summary  [name] ZOX:[id number] DOB: 01-Nov-1934 DOA: 11/26/2016  PCP: Joel Clan, MD  Admit date: 11/26/2016 Discharge date: 11/30/2016  Recommendations for Outpatient Follow-up:  1. Pt will need to follow up with PCP in 2-3 weeks post discharge 2. Please obtain BMP to evaluate electrolytes and kidney function 3. Please also check CBC to evaluate Hg and Hct levels 4. Patient has an appointment scheduled with neurosurgeon as outlined below  5. Please note that Amlodipine has been stopped due to soft Blood pressure and can be restarted depending on BP control   Discharge Diagnoses:  Principal Problem:   ARF (acute renal failure) (HCC) Active Problems:   Type 2 diabetes mellitus with vascular disease (HCC)   Essential hypertension   Dehydration   Acute pain of left knee   Discharge Condition: Stable  Diet recommendation: Heart healthy diet discussed in details   Brief Narrative:  Pt is 82 yo male presented to [facility] with one month long hx of left knee pain, intermittent lower back pain.   Assessment & Plan:   Principal Problem:   ARF (acute renal failure) (HCC), hyponatremia  - pre renal in etiology - improving with IVF  - good urine output   Active Problems:   Type 2 diabetes mellitus with vascular disease (HCC) - resume home medical regimen     Essential hypertension - was lower today so will hold Amlodipine on discharge     Acute pain of left knee, ? Trochanteric bursitis around knee cap - MRI knee stable, lumbar spine MRI with herniated disc at L3  - d/w neurosurgeon Dr. Lovell Sheehan, recommended outpatient follow up - medrol pack to be started as well per Dr. Lovell Sheehan recommendations  - follow up scheduled, please see detail below - pt does not feel safe at home alone - PT eval requested for consideration of SNF placement   DVT prophylaxis: Lovenox SQ Code Status: Full  Family Communication: Patient at bedside  Disposition Plan:  SNF  Consultants:   Ortho  Neurosurgery over the phone Dr. Lovell Sheehan   Procedures:   Left knee aspiration 6/19   Procedures/Studies: Mr Lumbar Spine Wo Contrast  Result Date: 11/28/2016 CLINICAL DATA:  Back pain left knee pain EXAM: MRI LUMBAR SPINE WITHOUT CONTRAST TECHNIQUE: Multiplanar, multisequence MR imaging of the lumbar spine was performed. No intravenous contrast was administered. COMPARISON:  None. FINDINGS: Segmentation:  Normal Alignment:  Normal Vertebrae:  Normal Conus medullaris: Extends to the L1-2 level and appears normal. Paraspinal and other soft tissues: Negative for retroperitoneal mass or adenopathy Asymmetric edema and atrophy in the erector splenium muscles on the left. No fluid collection or mass. Disc levels: L1-2:  Negative L2-3: Diffuse bulging of the disc with bilateral facet hypertrophy. Mild spinal stenosis. L3-4: Left foraminal and extraforaminal disc protrusion with extruded disc fragment extending cranially and compressing left L3 nerve root in the foramen. Bilateral facet hypertrophy. Mild spinal stenosis. Subarticular stenosis on the left. L4-5: Disc degeneration and disc bulging. Small central disc protrusion. Diffuse endplate spurring. Bilateral facet hypertrophy. Mild spinal stenosis. Subarticular stenosis bilaterally L5-S1: Disc bulging and mild endplate spurring. No significant stenosis. IMPRESSION: Extruded disc fragment on the left at L3-4 extending into the foramen and compressing the left L3 nerve root. Mild spinal stenosis. Asymmetric atrophy and edema in the erector spinae muscle on the left which may be related to denervation atrophy. Mild spinal stenosis and subarticular stenosis bilaterally L4-5 Mild spinal stenosis L2-3 Electronically Signed   By: Marlan Palau  M.D.   On: 11/28/2016 08:47   Mr Knee Left Wo Contrast  Result Date: 11/28/2016 CLINICAL DATA:  82 year old with progressive left knee pain and limited ambulation for 1 month. No acute  injury. Pain is primarily at the patella. EXAM: MRI OF THE LEFT KNEE WITHOUT CONTRAST TECHNIQUE: Multiplanar, multisequence MR imaging of the knee was performed. No intravenous contrast was administered. COMPARISON:  Radiograph 11/18/2016. FINDINGS: MENISCI Medial meniscus:  Intact with normal morphology. Lateral meniscus:  Intact with normal morphology. LIGAMENTS Cruciates:  Intact. Collaterals:  Intact. CARTILAGE Patellofemoral:  Well preserved. Medial:  Well preserved. Lateral:  Well preserved. MISCELLANEOUS Joint:  Minimal joint fluid. Popliteal Fossa:  Unremarkable. No significant Baker's cyst. Extensor Mechanism: Intact. Minimal spurring at the quadriceps insertion on the patella. No surrounding soft tissue abnormality. Bones:  No acute or significant extra-articular osseous findings. Other: No significant periarticular soft tissue findings. IMPRESSION: Normal examination without acute findings or explanation for the patient's symptoms. Electronically Signed   By: Carey Bullocks M.D.   On: 11/28/2016 09:32   Dg Knee Complete 4 Views Left  Result Date: 11/18/2016 CLINICAL DATA:  Pain EXAM: LEFT KNEE - COMPLETE 4+ VIEW COMPARISON:  None. FINDINGS: Frontal, lateral, and bilateral oblique views were obtained. There is no fracture or dislocation. No joint effusion. The joint spaces are normal. There is slight intracondylar spurring. There are multiple foci of arterial vascular calcification. IMPRESSION: Slight intracondylar spurring. No fracture or joint space narrowing. No joint effusion. Extensive atherosclerosis involving multiple lower extremity arterial vessels. Electronically Signed   By: Bretta Bang III M.D.   On: 11/18/2016 15:47   Discharge Exam: Vitals:   11/30/16 0415 11/30/16 0932  BP: 120/66 96/77  Pulse: 73 (!) 102  Resp: 17 18  Temp: 98.1 F (36.7 C) 97.9 F (36.6 C)   Vitals:   11/29/16 1807 11/29/16 2029 11/30/16 0415 11/30/16 0932  BP: 121/64 123/60 120/66 96/77  Pulse:  90 76 73 (!) 102  Resp: 16 17 17 18   Temp: 98.4 F (36.9 C) 97.9 F (36.6 C) 98.1 F (36.7 C) 97.9 F (36.6 C)  TempSrc: Oral Oral Oral Oral  SpO2: 100% 100% 99% 99%  Weight:  68.9 kg (152 lb)    Height:        General: Pt is alert, follows commands appropriately, not in acute distress Cardiovascular: Regular rate and rhythm, S1/S2 +, no murmurs, no rubs, no gallops Respiratory: Clear to auscultation bilaterally, no wheezing, no crackles, no rhonchi Abdominal: Soft, non tender, non distended, bowel sounds +, no guarding Extremities: no edema, no cyanosis, pulses palpable bilaterally DP and PT Neuro: Grossly nonfocal  Discharge Instructions  Discharge Instructions    Diet - low sodium heart healthy    Complete by:  As directed    Diet - low sodium heart healthy    Complete by:  As directed    Increase activity slowly    Complete by:  As directed    Increase activity slowly    Complete by:  As directed      Allergies as of 11/30/2016   No Known Allergies     Medication List    STOP taking these medications   amLODipine 5 MG tablet Commonly known as:  NORVASC     TAKE these medications   atorvastatin 40 MG tablet Commonly known as:  LIPITOR Take 40 mg by mouth every evening.   clopidogrel 75 MG tablet Commonly known as:  PLAVIX Take 75 mg by mouth daily.  glimepiride 2 MG tablet Commonly known as:  AMARYL Take 2 mg by mouth daily.   hydrochlorothiazide 12.5 MG tablet Commonly known as:  HYDRODIURIL Take 12.5 mg by mouth daily.   metFORMIN 1000 MG tablet Commonly known as:  GLUCOPHAGE Take 1,000 mg by mouth 2 (two) times daily.   methylPREDNISolone 4 MG Tbpk tablet Commonly known as:  MEDROL DOSEPAK Take as prescribed   metoprolol tartrate 50 MG tablet Commonly known as:  LOPRESSOR Take 50 mg by mouth 2 (two) times daily.   nitroGLYCERIN 0.4 MG SL tablet Commonly known as:  NITROSTAT Place 0.4 mg under the tongue every 5 (five) minutes as needed  for chest pain.   omega-3 acid ethyl esters 1 g capsule Commonly known as:  LOVAZA Take 2 g by mouth 2 (two) times daily.   oxyCODONE 5 MG immediate release tablet Commonly known as:  Oxy IR/ROXICODONE Take 1 tablet (5 mg total) by mouth every 6 (six) hours as needed for severe pain or breakthrough pain.   senna-docusate 8.6-50 MG tablet Commonly known as:  Senokot-S Take 1 tablet by mouth at bedtime as needed for mild constipation.   tamsulosin 0.4 MG Caps capsule Commonly known as:  FLOMAX Take 0.4 mg by mouth daily.   traMADol 50 MG tablet Commonly known as:  ULTRAM Take 1 tablet (50 mg total) by mouth 3 (three) times daily as needed for moderate pain.       Contact information for follow-up providers    Tressie Stalker, MD Follow up on 12/07/2016.   Specialty:  Neurosurgery Why:  Please arrive at 11 am on June 29th, 2018 Contact information: [address] [place] [address] [phone]        Dorothea Ogle, MD Follow up.   Specialty:  Internal Medicine Why:  please call me with any questions [phone] Contact information: [address] [place] [address] [phone]            Contact information for after-discharge care    Destination    [facility] Follow up.   Specialty:  Skilled Nursing Facility Contact information: [address] [place] [address] [phone]                   The results of significant diagnostics from this hospitalization (including imaging, microbiology, ancillary and laboratory) are listed below for reference.     Microbiology: No results found for this or any previous visit (from the past 240 hour(s)).   Labs: Basic Metabolic Panel:  Recent Labs Lab 11/26/16 1919 11/27/16 0729 11/28/16 0458 11/29/16 0448  NA 129* 136 137 136  K 4.3 3.9 4.1 3.9  CL 100* 108 108 108  CO2 18* 20* 23 23  GLUCOSE 132* 74 95 103*   BUN 66* 57* 37* 29*  CREATININE 2.47* 2.13* 1.74* 1.52*  CALCIUM 9.8 8.7* 8.5* 8.5*   CBC:  Recent Labs Lab 11/26/16 1919 11/27/16 0729 11/28/16 0458 11/29/16 0448  WBC 8.2 7.0 6.9 7.4  NEUTROABS 5.3  --   --   --   HGB 12.3* 10.9* 10.9* 10.6*  HCT 36.4* 33.0* 33.5* 32.6*  MCV 91.9 93.2 95.2 93.9  PLT 161 129* 140* 139*   Cardiac Enzymes:  Recent Labs Lab 11/26/16 2255  CKTOTAL 62    SIGNED: Time coordinating discharge: 45 minutes  Debbora Presto, MD  [facility] 11/30/2016, 4:27 PM Pager [phone]  If 7PM-7AM, please contact night-coverage [email or web address]  Password [email or web address]

## 2016-11-30 NOTE — Progress Notes (Signed)
Report called to RN at SNF, transport called, AVS reviewed with patient, IV and tele removed

## 2016-11-30 NOTE — Clinical Social Work Note (Signed)
Clinical Social Work Assessment  Patient Details  Name: [name] MRN: [id number] Date of Birth: Apr 01, 1935  Date of referral:  11/30/16               Reason for consult:  Facility Placement                Permission sought to share information with:  Family Supports Permission granted to share information::  No (Patient did not request that his brother & sister-in-law be contacted regarding his discharge.)  Name::        Agency::     Relationship::     Contact Information:     Housing/Transportation Living arrangements for the past 2 months:  Single Family Home Source of Information:  Patient Patient Interpreter Needed:  None Criminal Activity/Legal Involvement Pertinent to Current Situation/Hospitalization:    Significant Relationships:  Siblings (Patient has a brother Lorella NimrodHarvey and sistr-in-law Bonita QuinLinda. Patient has 2 son that he is estranged from) Lives with:  Self Do you feel safe going back to the place where you live?  No (Patient reported that he does not feel safe going home from the hospital as he lives alone and has no one that can assist him. Per patient his sister-in-law has shoulder problems.) Need for family participation in patient care:  No (Coment)  Care giving concerns:  Patient wants SNF for rehab because he lives alone and does not feel like he can safely take care of himself at home alone.  Mr. Shawnee KnappLevy reported that his brother and sister-in-law would not be able to provide assistance as his sister-in-law Bonita QuinLinda has shoulder problems.  Social Worker assessment / plan:  CSW talked with patient at the bedside regarding discharge and his request for [facility] rehab at a skilled facility ([facility] preference).  Mr. Shawnee KnappLevy was sitting in a chair eating lunch and is alert/oriented X4.  Patient indicated that he would feel more comfortable going to a facility for ST rehab and his preference is [facility]. When asked, patient reported that he had 3 children and 1  of them died and he has no contact with his other 2 sons. He does communicate with his brother Lorella NimrodHarvey and sister-in-law Bonita QuinLinda.   Employment status:  Retired Health and safety inspectornsurance information:  Armed forces operational officerMedicare, Games developerManaged Care (BCBS supplement) PT Recommendations:  Home with Home Health (PT recommended outpatient PT) Information / Referral to community resources:  Skilled Nursing Facility (SNF list not provided as patient provided facility preference)  Patient/Family's Response to care:  No concerns expressed by patient regarding care during hospitalization.  Patient/Family's Understanding of and Emotional Response to Diagnosis, Current Treatment, and Prognosis:  Not discussed.  Emotional Assessment Appearance:  Appears younger than stated age Attitude/Demeanor/Rapport:  Other (Appropriate) Affect (typically observed):  Pleasant, Appropriate Orientation:  Oriented to Self, Oriented to Place, Oriented to  Time, Oriented to Situation Alcohol / Substance use:  Tobacco Use, Alcohol Use, Illicit Drugs (Patient reported that he quit smoking 30 years ago and does not drink or use illicit drugs) Psych involvement (Current and /or in the community):  No (Comment)  Discharge Needs  Concerns to be addressed:  Discharge Planning Concerns Readmission within the last 30 days:  No Current discharge risk:  None Barriers to Discharge:  No Barriers Identified   Cristobal GoldmannCrawford, Attallah Ontko Bradley, LCSW 11/30/2016, 1:08 PM

## 2016-12-05 DIAGNOSIS — E1151 Type 2 diabetes mellitus with diabetic peripheral angiopathy without gangrene: Secondary | ICD-10-CM | POA: Diagnosis not present

## 2016-12-05 DIAGNOSIS — I2510 Atherosclerotic heart disease of native coronary artery without angina pectoris: Secondary | ICD-10-CM | POA: Diagnosis not present

## 2016-12-05 DIAGNOSIS — D6489 Other specified anemias: Secondary | ICD-10-CM | POA: Diagnosis not present

## 2016-12-05 DIAGNOSIS — I10 Essential (primary) hypertension: Secondary | ICD-10-CM | POA: Diagnosis not present

## 2016-12-05 DIAGNOSIS — M5187 Other intervertebral disc disorders, lumbosacral region: Secondary | ICD-10-CM | POA: Diagnosis not present

## 2016-12-07 DIAGNOSIS — I10 Essential (primary) hypertension: Secondary | ICD-10-CM | POA: Diagnosis not present

## 2016-12-07 DIAGNOSIS — M5126 Other intervertebral disc displacement, lumbar region: Secondary | ICD-10-CM | POA: Diagnosis not present

## 2016-12-07 DIAGNOSIS — M5416 Radiculopathy, lumbar region: Secondary | ICD-10-CM | POA: Diagnosis not present

## 2016-12-11 ENCOUNTER — Other Ambulatory Visit: Payer: Self-pay | Admitting: Neurosurgery

## 2016-12-24 DIAGNOSIS — M5136 Other intervertebral disc degeneration, lumbar region: Secondary | ICD-10-CM | POA: Diagnosis not present

## 2016-12-24 DIAGNOSIS — E1142 Type 2 diabetes mellitus with diabetic polyneuropathy: Secondary | ICD-10-CM | POA: Diagnosis not present

## 2016-12-24 DIAGNOSIS — I2510 Atherosclerotic heart disease of native coronary artery without angina pectoris: Secondary | ICD-10-CM | POA: Diagnosis not present

## 2016-12-24 DIAGNOSIS — Z87891 Personal history of nicotine dependence: Secondary | ICD-10-CM | POA: Diagnosis not present

## 2016-12-24 DIAGNOSIS — Z9181 History of falling: Secondary | ICD-10-CM | POA: Diagnosis not present

## 2016-12-24 DIAGNOSIS — I10 Essential (primary) hypertension: Secondary | ICD-10-CM | POA: Diagnosis not present

## 2016-12-24 DIAGNOSIS — Z955 Presence of coronary angioplasty implant and graft: Secondary | ICD-10-CM | POA: Diagnosis not present

## 2016-12-24 DIAGNOSIS — E1121 Type 2 diabetes mellitus with diabetic nephropathy: Secondary | ICD-10-CM | POA: Diagnosis not present

## 2016-12-24 DIAGNOSIS — M5126 Other intervertebral disc displacement, lumbar region: Secondary | ICD-10-CM | POA: Diagnosis not present

## 2016-12-24 DIAGNOSIS — E1151 Type 2 diabetes mellitus with diabetic peripheral angiopathy without gangrene: Secondary | ICD-10-CM | POA: Diagnosis not present

## 2016-12-24 DIAGNOSIS — Z7984 Long term (current) use of oral hypoglycemic drugs: Secondary | ICD-10-CM | POA: Diagnosis not present

## 2016-12-24 DIAGNOSIS — Z951 Presence of aortocoronary bypass graft: Secondary | ICD-10-CM | POA: Diagnosis not present

## 2016-12-25 ENCOUNTER — Encounter (HOSPITAL_COMMUNITY)
Admission: RE | Admit: 2016-12-25 | Discharge: 2016-12-25 | Disposition: A | Discharge status: Home / Self Care | Payer: Medicare Other | Source: Ambulatory Visit | Attending: Neurosurgery | Admitting: Neurosurgery

## 2016-12-25 ENCOUNTER — Encounter (HOSPITAL_COMMUNITY): Payer: Self-pay | Admitting: Vascular Surgery

## 2016-12-25 ENCOUNTER — Encounter (HOSPITAL_COMMUNITY): Payer: Self-pay

## 2016-12-25 DIAGNOSIS — Z7984 Long term (current) use of oral hypoglycemic drugs: Secondary | ICD-10-CM | POA: Insufficient documentation

## 2016-12-25 DIAGNOSIS — I10 Essential (primary) hypertension: Secondary | ICD-10-CM | POA: Insufficient documentation

## 2016-12-25 DIAGNOSIS — I2510 Atherosclerotic heart disease of native coronary artery without angina pectoris: Secondary | ICD-10-CM | POA: Diagnosis not present

## 2016-12-25 DIAGNOSIS — M5416 Radiculopathy, lumbar region: Secondary | ICD-10-CM | POA: Insufficient documentation

## 2016-12-25 DIAGNOSIS — Z01810 Encounter for preprocedural cardiovascular examination: Secondary | ICD-10-CM | POA: Diagnosis not present

## 2016-12-25 DIAGNOSIS — E119 Type 2 diabetes mellitus without complications: Secondary | ICD-10-CM | POA: Diagnosis not present

## 2016-12-25 DIAGNOSIS — Z79899 Other long term (current) drug therapy: Secondary | ICD-10-CM | POA: Diagnosis not present

## 2016-12-25 DIAGNOSIS — Z87891 Personal history of nicotine dependence: Secondary | ICD-10-CM | POA: Diagnosis not present

## 2016-12-25 DIAGNOSIS — Z7982 Long term (current) use of aspirin: Secondary | ICD-10-CM | POA: Insufficient documentation

## 2016-12-25 DIAGNOSIS — Z01812 Encounter for preprocedural laboratory examination: Secondary | ICD-10-CM | POA: Insufficient documentation

## 2016-12-25 HISTORY — DX: Atherosclerotic heart disease of native coronary artery without angina pectoris: I25.10

## 2016-12-25 HISTORY — DX: Essential (primary) hypertension: I10

## 2016-12-25 LAB — CBC
HCT: 32.7 % — ABNORMAL LOW (ref 39.0–52.0)
HEMOGLOBIN: 10.8 g/dL — AB (ref 13.0–17.0)
MCH: 30.7 pg (ref 26.0–34.0)
MCHC: 33 g/dL (ref 30.0–36.0)
MCV: 92.9 fL (ref 78.0–100.0)
PLATELETS: 173 10*3/uL (ref 150–400)
RBC: 3.52 MIL/uL — ABNORMAL LOW (ref 4.22–5.81)
RDW: 13.6 % (ref 11.5–15.5)
WBC: 5.3 10*3/uL (ref 4.0–10.5)

## 2016-12-25 LAB — SURGICAL PCR SCREEN
MRSA, PCR: NEGATIVE
Staphylococcus aureus: NEGATIVE

## 2016-12-25 LAB — BASIC METABOLIC PANEL
ANION GAP: 9 (ref 5–15)
BUN: 31 mg/dL — ABNORMAL HIGH (ref 6–20)
CALCIUM: 9.4 mg/dL (ref 8.9–10.3)
CO2: 23 mmol/L (ref 22–32)
Chloride: 105 mmol/L (ref 101–111)
Creatinine, Ser: 1.77 mg/dL — ABNORMAL HIGH (ref 0.61–1.24)
GFR calc Af Amer: 40 mL/min — ABNORMAL LOW (ref >60)
GFR, EST NON AFRICAN AMERICAN: 35 mL/min — AB (ref >60)
GLUCOSE: 118 mg/dL — AB (ref 65–99)
Potassium: 4.3 mmol/L (ref 3.5–5.1)
Sodium: 137 mmol/L (ref 135–145)

## 2016-12-25 LAB — GLUCOSE, CAPILLARY: Glucose-Capillary: 130 mg/dL — ABNORMAL HIGH (ref 65–99)

## 2016-12-25 NOTE — Progress Notes (Addendum)
PCP: Dr. Martha ClanWilliam shaw @ [facility].   No cardiologist.  Fasting sugars 130  Pt. To contact Dr. Lovell SheehanJenkins when to stop aspirin. Pt. Stopped plavix 12/20/16.

## 2016-12-25 NOTE — Pre-Procedure Instructions (Addendum)
Guadalupe MapleWilliam F Cammarata  12/25/2016      [facility] [place] - [address] [place] [id number] Phone: [phone] Fax: [phone]    Your procedure is scheduled on Thurs. July 19  [facility] at 8:15 A.M.  Call this number if you have problems the morning of surgery:  [phone]   Remember:  Do not eat food or drink liquids after midnight on Wed. July 18   Take these medicines the morning of surgery with A SIP OF WATER : amlodipine(norvasc),cyclobenzaprine (flexeril) if needed, metoprolol tartrate (lopressor), tramadol if needed              Stop advil, motrin, ibuprofen, aleve, BC Powders, Goody's, vitamins and herbal medicines.              Stop plavix and aspirin per Dr. Lovell SheehanJenkins                 How to Manage Your Diabetes Before and After Surgery  Why is it important to control my blood sugar before and after surgery? . Improving blood sugar levels before and after surgery helps healing and can limit problems. . A way of improving blood sugar control is eating a healthy diet by: o  Eating less sugar and carbohydrates o  Increasing activity/exercise o  Talking with your doctor about reaching your blood sugar goals . High blood sugars (greater than 180 mg/dL) can raise your risk of infections and slow your recovery, so you will need to focus on controlling your diabetes during the weeks before surgery. . Make sure that the doctor who takes care of your diabetes knows about your planned surgery including the date and location.  How do I manage my blood sugar before surgery? . Check your blood sugar at least 4 times a day, starting 2 days before surgery, to make sure that the level is not too high or low. o Check your blood sugar the morning of your surgery when you wake up and every 2 hours until you get to the Short Stay unit. . If your blood sugar is less than 70 mg/dL, you will need to treat  for low blood sugar: o Do not take insulin. o Treat a low blood sugar (less than 70 mg/dL) with  cup of clear juice (cranberry or apple), 4 glucose tablets, OR glucose gel. o Recheck blood sugar in 15 minutes after treatment (to make sure it is greater than 70 mg/dL). If your blood sugar is not greater than 70 mg/dL on recheck, call [phone] for further instructions. . Report your blood sugar to the short stay nurse when you get to Short Stay.  . If you are admitted to the hospital after surgery: o Your blood sugar will be checked by the staff and you will probably be given insulin after surgery (instead of oral diabetes medicines) to make sure you have good blood sugar levels. o The goal for blood sugar control after surgery is 80-180 mg/dL.       WHAT DO I DO ABOUT MY DIABETES MEDICATION?   Marland Kitchen. Do not take oral diabetes medicines (pills) the morning of surgery    Do not wear jewelry.  Do not wear lotions, powders, or perfumes, or deoderant.  Do not shave 48 hours prior to surgery.  Men may shave face and neck.  Do not bring valuables to the hospital.  [facility] is not [facility]  any belongings or valuables.  Contacts, dentures or bridgework may not be worn into surgery.  Leave your suitcase in the car.  After surgery it may be brought to your room.  For patients admitted to the hospital, discharge time will be determined by your treatment team.  Patients discharged the day of surgery will not be allowed to drive home.    Special instructions:   [facility]- Preparing For Surgery  Before surgery, you can play an important role. Because skin is not sterile, your skin needs to be as free of germs as possible. You can reduce the number of germs on your skin by washing with CHG (chlorahexidine gluconate) Soap before surgery.  CHG is an antiseptic cleaner which kills germs and bonds with the skin to continue killing germs even after washing.  Please do not use if you have  an allergy to CHG or antibacterial soaps. If your skin becomes reddened/irritated stop using the CHG.  Do not shave (including legs and underarms) for at least 48 hours prior to first CHG shower. It is OK to shave your face.  Please follow these instructions carefully.   1. Shower the NIGHT BEFORE SURGERY and the MORNING OF SURGERY with CHG.   2. If you chose to wash your hair, wash your hair first as usual with your normal shampoo.  3. After you shampoo, rinse your hair and body thoroughly to remove the shampoo.  4. Use CHG as you would any other liquid soap. You can apply CHG directly to the skin and wash gently with a scrungie or a clean washcloth.   5. Apply the CHG Soap to your body ONLY FROM THE NECK DOWN.  Do not use on open wounds or open sores. Avoid contact with your eyes, ears, mouth and genitals (private parts). Wash genitals (private parts) with your normal soap.  6. Wash thoroughly, paying special attention to the area where your surgery will be performed.  7. Thoroughly rinse your body with warm water from the neck down.  8. DO NOT shower/wash with your normal soap after using and rinsing off the CHG Soap.  9. Pat yourself dry with a CLEAN TOWEL.   10. Wear CLEAN PAJAMAS   11. Place CLEAN SHEETS on your bed the night of your first shower and DO NOT SLEEP WITH PETS.    Day of Surgery: Do not apply any deodorants/lotions. Please wear clean clothes to the hospital/surgery center.      Please read over the following fact sheets that you were given. Coughing and Deep Breathing, MRSA Information and Surgical Site Infection Prevention

## 2016-12-25 NOTE — Progress Notes (Addendum)
Anesthesia Note: Patient is a 82 year old male scheduled for left L3-4 microdiscectomy on 12/27/16 by Dr. Tressie Stalker.  History includes CAD s/p CABG (LIMA-LAD, SVG-D2, SVG-OM-RCA-PDA) '91 (s/p PTCA SVG-RCA) '97 (s/p PTCA/Elite stent SVG-CX) 04/15/00 (s/p PTCA/Elite stent SVG-RCA) 04/19/00 (SVG-RCA occluded, s/p 3 overlapping Mini Vision RCA stents) 01/03/06 (s/p complicated Promus DES RCA) 12/15/09, DM2, HTN, former smoker (quit '88).  - Hospitalized 6/18-6/22/18 with acute (likely on chronic) renal failure and acute left knee pain (started in left flank then progressed to left hip and thigh and "settled" in his left knee). Renal function improved with hydration and holding HCTZ. He had a norma MRI on the knee, but L3 disc protrusion on MRI. He was started on Medrol Pack and discharged to [facility] for PT with out-patient neurosurgery follow-up. He was discharged home from [facility] on 12/21/16 and is receiving HHPT. Patient lives alone.    PCP is Dr. [name] with [facility]. Patient has next appointment schedueld for 12/26/16 at 4:00 PM.  Cardiologist was Dr. Dr. Susa Griffins (previously saw Dr. Chanda Busing), now retired. Last visit seen (scanned under Media tab, Correspondence, 12/13/09) was on 12/08/09 in which he recommended cardiac cath due to recurrent type anginal symptoms despite low risk stress test. He underwent complicated DES to RCA at that time.  Meds include amlodipine, Lipitor, Plavix (on hold since 12/20/16), Flexeril, Amaryl, HCTZ, lisinopril-HCTZ, metformin, Lopressor, nitroglycerin, Lovaza (on hold), Flomax, tramadol. He took ASA 81 mg this morning, but then realized it was also on the hold med list per surgeon--Nicki at Dr. Lovell Sheehan' office notified. (He normally takes ASA 81 mg daily, but had been off of it since his June hospitalization.)   BP (!) 113/58   Pulse 79   Temp 36.9 C   Resp 20   Ht 5\' 8"  (1.727 m)   Wt 158 lb (71.7 kg)   SpO2 98%   BMI  24.02 kg/m  Exam shows a pleasant Caucasian male in NAD. He is sitting in a hospital wheelchair. He denied chest pain, SOB, syncope, heart racing, edema. Up until his June hospitalization, he said he was fairly active (ie, able to ride [employer], weed eat, clean/vacuum his house). He did not have CP or SOB with this level of activity. Activity is now limited by his LLE pain (which is intermittent, positional). Heart RRR. I did not appreciate a murmur or carotid bruits. Lungs clear except few faint right basilar crackles. He denied any fever or cough. He had trace bilateral ankle edema, R > L.   EKG 12/25/16: SR with first degree AV block, LAD, right BBB. Right BBB is new when compared to tracings from 12/2009 in [facility].  LHC 12/13/09: Summary: LM: 80% distal LM stenosis. LAD: Occluded proximally. LCX: Occluded proximally. RCA: Long 70% smooth proximal stenosis. The proximal stent was patent. There is a 50% stenosis of the midportion, 99% stenosis at the genu just before the long stented segment. The long stented segment was patent except for focal area of 95% in-stent restenosis within the PLA branch. VEIN GRAFTS: SVG-OM1, OM2, and distal CX widely patent with a 50% stenosis between OM1 and OM2 within the shaft of the graft. SVG-OM widely patent. This was a small DIAG branch which was bifurcating. There was retrograde flow up the DIAG and down the LAD to the point of LIMA insertion. LIMA widely patent.  EF: LVEF 45-50% with mild inferobasal hypokinesis. PCI 12/15/09 (unsuccessful PCI 12/13/09): Difficult, but successful intervention at multiple  sites involving the RCA with PTCA done essentially throughout the entire vessel with a 90% posterior lateral artery stenosis being reduced to 10%, 95% acute marginal stenosis. Reduced approximate 30%, 50% mid lesion being reduced to 0% and a 95% ostial calcified stenosis be reduced to approximately 3040% with insertion of a 2.5 x 8 mm Promus stent postdilated to  approximately 2.6 mm.   Nuclear stress test 12/05/09 (scanned under Media tab, Correspondence, 12/13/09): Impression: There is a mild to moderate perfusion defect due to infarct/scar with mild peri-infarct ischemia seen in the basal inferior, mid inferior and apical inferior regions. Post stress EF 52%. Global LV systolic function is normal. EKG is negative for ischemia. This is a low risk scan. Compared to the previous study, there is no significant change.  Echo 12/08/09 (scanned under Media tab, Correspondence, 12/13/09): Summary: There is normal left ventricular wall thickness. Left ventricular systolic function is low normal. There is mild anterior wall hypokinesis. There is mild tricuspid regurgitation. RVSP is elevated at 30-40 mmHg. There is mild to moderate mitral regurgitation. The mitral regurgitant jet is is eccentrically directed. LA is mild to moderately dilated.  MRI L-spine 11/28/16: IMPRESSION: - Extruded disc fragment on the left at L3-4 extending into the foramen and compressing the left L3 nerve root. Mild spinal stenosis. - Asymmetric atrophy and edema in the erector spinae muscle on the left which may be related to denervation atrophy. - Mild spinal stenosis and subarticular stenosis bilaterally L4-5 - Mild spinal stenosis L2-3  Preoperative labs noted. BUN 31. Cr 1.77. (BUN 66, Cr 2.47 when admitted 11/26/16 for ARF; BUN 29, Cr 1.52 at discharge on 11/29/16. Cr was ~ 1.3-1.4 in 12/2009.). WBC 5.3. H/H 10.8/32.7. PLT 173K. A1c is pending. He reports fasting CBGs ~ 130, although CBGs higher when he was on recent steroid taper.  Patient has known CAD and DM2. Currently, he denied CV symptoms. He does have new EKG changes since 2011. Discussed with anesthesiologist Dr. Bradley FerrisEllender who agrees that patient should have preoperative cardiology evaluation. Nicki at Dr. Lovell SheehanJenkins' office notified.  Velna Ochsllison Harlyn Italiano, PA-C [facility]/Anesthesiology Phone [phone] 12/25/2016  5:01 PM  Addendum: Reviewed 12/26/16 office note from Dr. Martha ClanWilliam Shaw. He reviewed PAT labs. He was in agreement with need for preoperative cardiology evaluation.   Patient was seen by cardiologist Dr. Tomie Chinaevankar on 12/28/16. He is scheduled for a stress test on 01/01/17. "If this is negative, he is not at high risk for coronary events during the aforementioned surgery. Meticulous hemodynamic monitoring and continued beta blockade will further reduce the risk of events. If his stress test is negative, he can hold withhold clopidogrel therapy for approximately 5 days before the planned procedure and start it as soon as he is surgeon okays it."  Will follow-up stress test results.  Velna Ochsllison Eero Dini, PA-C [facility]Anesthesiology Phone [phone] 12/28/2016 5:03 PM

## 2016-12-26 DIAGNOSIS — N183 Chronic kidney disease, stage 3 (moderate): Secondary | ICD-10-CM | POA: Diagnosis not present

## 2016-12-26 DIAGNOSIS — E784 Other hyperlipidemia: Secondary | ICD-10-CM | POA: Diagnosis not present

## 2016-12-26 DIAGNOSIS — Z9861 Coronary angioplasty status: Secondary | ICD-10-CM | POA: Diagnosis not present

## 2016-12-26 DIAGNOSIS — M5416 Radiculopathy, lumbar region: Secondary | ICD-10-CM | POA: Diagnosis not present

## 2016-12-26 DIAGNOSIS — Z955 Presence of coronary angioplasty implant and graft: Secondary | ICD-10-CM | POA: Diagnosis not present

## 2016-12-26 DIAGNOSIS — I10 Essential (primary) hypertension: Secondary | ICD-10-CM | POA: Diagnosis not present

## 2016-12-26 DIAGNOSIS — Z6825 Body mass index (BMI) 25.0-25.9, adult: Secondary | ICD-10-CM | POA: Diagnosis not present

## 2016-12-26 DIAGNOSIS — E1129 Type 2 diabetes mellitus with other diabetic kidney complication: Secondary | ICD-10-CM | POA: Diagnosis not present

## 2016-12-26 DIAGNOSIS — Z0000 Encounter for general adult medical examination without abnormal findings: Secondary | ICD-10-CM | POA: Diagnosis not present

## 2016-12-26 DIAGNOSIS — Z01818 Encounter for other preprocedural examination: Secondary | ICD-10-CM | POA: Diagnosis not present

## 2016-12-26 LAB — HEMOGLOBIN A1C
HEMOGLOBIN A1C: 7.5 % — AB (ref 4.8–5.6)
Mean Plasma Glucose: 169 mg/dL

## 2016-12-27 DIAGNOSIS — E1142 Type 2 diabetes mellitus with diabetic polyneuropathy: Secondary | ICD-10-CM | POA: Diagnosis not present

## 2016-12-27 DIAGNOSIS — M5126 Other intervertebral disc displacement, lumbar region: Secondary | ICD-10-CM | POA: Diagnosis not present

## 2016-12-27 DIAGNOSIS — I2510 Atherosclerotic heart disease of native coronary artery without angina pectoris: Secondary | ICD-10-CM | POA: Diagnosis not present

## 2016-12-27 DIAGNOSIS — E1151 Type 2 diabetes mellitus with diabetic peripheral angiopathy without gangrene: Secondary | ICD-10-CM | POA: Diagnosis not present

## 2016-12-27 DIAGNOSIS — M5136 Other intervertebral disc degeneration, lumbar region: Secondary | ICD-10-CM | POA: Diagnosis not present

## 2016-12-27 DIAGNOSIS — E1121 Type 2 diabetes mellitus with diabetic nephropathy: Secondary | ICD-10-CM | POA: Diagnosis not present

## 2016-12-28 ENCOUNTER — Ambulatory Visit (INDEPENDENT_AMBULATORY_CARE_PROVIDER_SITE_OTHER): Payer: Medicare Other | Admitting: Cardiology

## 2016-12-28 ENCOUNTER — Encounter: Payer: Self-pay | Admitting: Cardiology

## 2016-12-28 VITALS — BP 110/52 | HR 75 | Ht 68.0 in | Wt 155.4 lb

## 2016-12-28 DIAGNOSIS — E782 Mixed hyperlipidemia: Secondary | ICD-10-CM | POA: Diagnosis not present

## 2016-12-28 DIAGNOSIS — I10 Essential (primary) hypertension: Secondary | ICD-10-CM | POA: Diagnosis not present

## 2016-12-28 DIAGNOSIS — Z951 Presence of aortocoronary bypass graft: Secondary | ICD-10-CM | POA: Diagnosis not present

## 2016-12-28 DIAGNOSIS — I2510 Atherosclerotic heart disease of native coronary artery without angina pectoris: Secondary | ICD-10-CM | POA: Diagnosis not present

## 2016-12-28 DIAGNOSIS — Z01810 Encounter for preprocedural cardiovascular examination: Secondary | ICD-10-CM

## 2016-12-28 DIAGNOSIS — E1159 Type 2 diabetes mellitus with other circulatory complications: Secondary | ICD-10-CM

## 2016-12-28 NOTE — Addendum Note (Signed)
Addended by: Rodney LangtonITTER, JADA M on: 12/28/2016 10:53 AM   Modules accepted: Orders

## 2016-12-28 NOTE — Patient Instructions (Signed)
Medication Instructions:  Please refer to your instruction sheet for medication instructions for the day of your Nuclear Test.  Your physician recommends that you continue on your current medications as directed. Please refer to the Current Medication list given to you today.   Labwork: None    Testing/Procedures: Your physician has requested that you have a lexiscan myoview. For further information please visit [email or web address]. Please follow instruction sheet, as given.   Follow-Up: Your physician recommends that you schedule a follow-up appointment in: 6 months with Dr. Tomie Chinaevankar.  Any Other Special Instructions Will Be Listed Below (If Applicable).     If you need a refill on your cardiac medications before your next appointment, please call your pharmacy.

## 2016-12-28 NOTE — Progress Notes (Signed)
Cardiology Office Note:    Date:  12/28/2016   ID:  [name], DOB [birth year], MRN [id number]  PCP:  [name], [name], MD  Cardiologist:  Joel Brothersajan R Revankar, MD   Referring MD: [name], [name], MD    ASSESSMENT:    1. Preoperative cardiovascular examination   2. Coronary artery disease involving native coronary artery of native heart without angina pectoris   3. Essential hypertension   4. Type 2 diabetes mellitus with vascular disease (HCC)   5. Mixed dyslipidemia   6. Hx of CABG    PLAN:    In order of problems listed above:  1. I discussed preoperative risk stratification process with the patient at extensive length. He has known coronary artery disease. He is asymptomatic but leads a sedentary lifestyle. In view of this he will undergo Lexiscan sestamibi. If this is negative, he is not at high risk for coronary events during the aforementioned surgery. Meticulous hemodynamic monitoring and continued beta blockade will further reduce the risk of events. If his stress test is negative, he can hold withhold clopidogrel therapy for approximately 5 days before the planned procedure and start it as soon as he is Careers advisersurgeon okays it. 2. Coronary artery disease appears to be clinically stable but will need to be assessed as mentioned above. 3. Blood pressure stable 4. Diabetes was discussed for diabetes mellitus and dyslipidemia. These issues are handled by his primary care physician. Patient will be seen in follow-up appointment in 6 months or earlier if he has any concerns. He had multiple questions which were answered to his satisfaction.    Medication Adjustments/Labs and Tests Ordered: Current medicines are reviewed at length with the patient today.  Concerns regarding medicines are outlined above.  No orders of the defined types were placed in this encounter.  No orders of the defined types were placed in this encounter.    History of Present Illness:    [name] is a 82  y.o. male who is being seen today for the evaluation of Coronary artery disease and preoperative risk stratification at the request of [name], [name], MD. Patient is a pleasant 82 year old male. He is previously unknown to me. He's had coronary artery disease. He's had CABG surgery and multiple coronary stenting in the remote past for at least more than 2-3 years ago. He has history of essential hypertension, dyslipidemia and diabetes mellitus. He has orthopedic problems and does not ambulate much due to sedentary lifestyle. He is here in a wheelchair today. He ambulates minimally with. He is due to undergo knee surgery for which she is here for preoperative risk assessment. He denies any chest pain orthopnea or PND and takes care of activities of daily living. He always carries sublingual nitroglycerin with improvement coronary artery disease.  Past Medical History:  Diagnosis Date  . Coronary artery disease   . Diabetes mellitus without complication (HCC)   . Hypertension     Past Surgical History:  Procedure Laterality Date  . CORONARY ARTERY BYPASS GRAFT  1991    Current Medications: Current Meds  Medication Sig  . amLODipine (NORVASC) 5 MG tablet Take 5 mg by mouth daily.  Marland Kitchen. atorvastatin (LIPITOR) 40 MG tablet Take 40 mg by mouth daily.   . clopidogrel (PLAVIX) 75 MG tablet Take 75 mg by mouth daily.  . cyclobenzaprine (FLEXERIL) 10 MG tablet Take 10 mg by mouth 2 (two) times daily as needed for muscle spasms.  Marland Kitchen. glimepiride (AMARYL) 2 MG tablet Take 2  mg by mouth daily.  . hydrochlorothiazide (HYDRODIURIL) 12.5 MG tablet Take 12.5 mg by mouth daily.  Marland Kitchen lisinopril-hydrochlorothiazide (PRINZIDE,ZESTORETIC) 20-12.5 MG tablet Take 1 tablet by mouth 2 (two) times daily.  . metFORMIN (GLUCOPHAGE) 1000 MG tablet Take 1,000 mg by mouth 2 (two) times daily.  . metoprolol tartrate (LOPRESSOR) 50 MG tablet Take 50 mg by mouth 2 (two) times daily.  . nitroGLYCERIN (NITROSTAT) 0.4 MG SL tablet  Place 0.4 mg under the tongue every 5 (five) minutes as needed for chest pain.   Marland Kitchen omega-3 acid ethyl esters (LOVAZA) 1 g capsule Take 1 g by mouth 2 (two) times daily.   Marland Kitchen oxyCODONE (OXY IR/ROXICODONE) 5 MG immediate release tablet Take 1 tablet (5 mg total) by mouth every 6 (six) hours as needed for severe pain or breakthrough pain.  Marland Kitchen senna-docusate (SENOKOT-S) 8.6-50 MG tablet Take 1 tablet by mouth at bedtime as needed for mild constipation.  . tamsulosin (FLOMAX) 0.4 MG CAPS capsule Take 0.4 mg by mouth at bedtime.   . traMADol (ULTRAM) 50 MG tablet Take 1 tablet (50 mg total) by mouth 3 (three) times daily as needed for moderate pain.     Allergies:   Patient has no known allergies.   Social History   Social History  . Marital status: Divorced    Spouse name: N/A  . Number of children: N/A  . Years of education: N/A   Social History Main Topics  . Smoking status: Former Smoker    Quit date: 11/27/1986  . Smokeless tobacco: Never Used  . Alcohol use No  . Drug use: No  . Sexual activity: Not Asked   Other Topics Concern  . None   Social History Narrative  . None     Family History: The patient's family history includes Hypertension in his other.  ROS:   Please see the history of present illness.    All other systems reviewed and are negative.  EKGs/Labs/Other Studies Reviewed:    The following studies were reviewed today: I reviewed his records from the primary care office notes extensively.   Recent Labs: 12/25/2016: BUN 31; Creatinine, Ser 1.77; Hemoglobin 10.8; Platelets 173; Potassium 4.3; Sodium 137  Recent Lipid Panel    Component Value Date/Time   CHOL  12/15/2009 0528    117        ATP III CLASSIFICATION:  <200     mg/dL   Desirable  161-096  mg/dL   Borderline High  >=045    mg/dL   High          TRIG 409 (H) 12/15/2009 0528   HDL 44 12/15/2009 0528   CHOLHDL 2.7 12/15/2009 0528   VLDL 35 12/15/2009 0528   LDLCALC  12/15/2009 0528    38         Total Cholesterol/HDL:CHD Risk Coronary Heart Disease Risk Table                     Men   Women  1/2 Average Risk   3.4   3.3  Average Risk       5.0   4.4  2 X Average Risk   9.6   7.1  3 X Average Risk  23.4   11.0        Use the calculated Patient Ratio above and the CHD Risk Table to determine the patient's CHD Risk.        ATP III CLASSIFICATION (LDL):  <100  mg/dL   Optimal  161-096  mg/dL   Near or Above                    Optimal  130-159  mg/dL   Borderline  045-409  mg/dL   High  >811     mg/dL   Very High    Physical Exam:    VS:  BP (!) 110/52   Pulse 75   Ht 5\' 8"  (1.727 m)   Wt 155 lb 6.4 oz (70.5 kg)   SpO2 96%   BMI 23.63 kg/m     Wt Readings from Last 3 Encounters:  12/28/16 155 lb 6.4 oz (70.5 kg)  12/25/16 158 lb (71.7 kg)  11/29/16 152 lb (68.9 kg)     GEN: Patient is in no acute distress HEENT: Normal NECK: No JVD; No carotid bruits LYMPHATICS: No lymphadenopathy CARDIAC: S1 S2 regular, 2/6 systolic murmur at the apex. RESPIRATORY:  Clear to auscultation without rales, wheezing or rhonchi  ABDOMEN: Soft, non-tender, non-distended MUSCULOSKELETAL:  No edema; No deformity  SKIN: Warm and dry NEUROLOGIC:  Alert and oriented x 3 PSYCHIATRIC:  Normal affect    Signed, Joel Brothers, MD  12/28/2016 10:39 AM    [facility]

## 2016-12-31 ENCOUNTER — Telehealth (HOSPITAL_COMMUNITY): Payer: Self-pay | Admitting: *Deleted

## 2016-12-31 NOTE — Telephone Encounter (Signed)
Left message on voicemail per DPR in reference to upcoming appointment scheduled on 01/01/17 with detailed instructions given per Myocardial Perfusion Study Information Sheet for the test. LM to arrive 15 minutes early, and that it is imperative to arrive on time for appointment to keep from having the test rescheduled. If you need to cancel or reschedule your appointment, please call the office within 24 hours of your appointment. Failure to do so may result in a cancellation of your appointment, and a $50 no show fee. Phone number given for call back for any questions. [name]    

## 2017-01-01 ENCOUNTER — Ambulatory Visit (HOSPITAL_COMMUNITY): Payer: Medicare Other | Attending: Cardiology

## 2017-01-01 DIAGNOSIS — E1121 Type 2 diabetes mellitus with diabetic nephropathy: Secondary | ICD-10-CM | POA: Diagnosis not present

## 2017-01-01 DIAGNOSIS — E782 Mixed hyperlipidemia: Secondary | ICD-10-CM

## 2017-01-01 DIAGNOSIS — E1142 Type 2 diabetes mellitus with diabetic polyneuropathy: Secondary | ICD-10-CM | POA: Diagnosis not present

## 2017-01-01 DIAGNOSIS — M5126 Other intervertebral disc displacement, lumbar region: Secondary | ICD-10-CM | POA: Diagnosis not present

## 2017-01-01 DIAGNOSIS — Z951 Presence of aortocoronary bypass graft: Secondary | ICD-10-CM

## 2017-01-01 DIAGNOSIS — R9439 Abnormal result of other cardiovascular function study: Secondary | ICD-10-CM | POA: Diagnosis not present

## 2017-01-01 DIAGNOSIS — E1151 Type 2 diabetes mellitus with diabetic peripheral angiopathy without gangrene: Secondary | ICD-10-CM | POA: Diagnosis not present

## 2017-01-01 DIAGNOSIS — I10 Essential (primary) hypertension: Secondary | ICD-10-CM | POA: Diagnosis not present

## 2017-01-01 DIAGNOSIS — Z01810 Encounter for preprocedural cardiovascular examination: Secondary | ICD-10-CM | POA: Diagnosis not present

## 2017-01-01 DIAGNOSIS — I2510 Atherosclerotic heart disease of native coronary artery without angina pectoris: Secondary | ICD-10-CM

## 2017-01-01 DIAGNOSIS — E119 Type 2 diabetes mellitus without complications: Secondary | ICD-10-CM | POA: Insufficient documentation

## 2017-01-01 DIAGNOSIS — M5136 Other intervertebral disc degeneration, lumbar region: Secondary | ICD-10-CM | POA: Diagnosis not present

## 2017-01-01 LAB — MYOCARDIAL PERFUSION IMAGING
LHR: 0.34
LVDIAVOL: 106 mL (ref 62–150)
LVSYSVOL: 62 mL
NUC STRESS TID: 0.94
Peak HR: 114 {beats}/min
Rest HR: 77 {beats}/min
SDS: 11
SRS: 25
SSS: 24

## 2017-01-01 MED ORDER — TECHNETIUM TC 99M TETROFOSMIN IV KIT
32.6000 | PACK | Freq: Once | INTRAVENOUS | Status: AC | PRN
  Administered 2017-01-01: 32.6 via INTRAVENOUS
  Filled 2017-01-01: qty 33

## 2017-01-01 MED ORDER — TECHNETIUM TC 99M TETROFOSMIN IV KIT
10.2000 | PACK | Freq: Once | INTRAVENOUS | Status: AC | PRN
  Administered 2017-01-01: 10.2 via INTRAVENOUS
  Filled 2017-01-01: qty 11

## 2017-01-01 MED ORDER — REGADENOSON 0.4 MG/5ML IV SOLN
0.4000 mg | Freq: Once | INTRAVENOUS | Status: AC
  Administered 2017-01-01: 0.4 mg via INTRAVENOUS

## 2017-01-02 DIAGNOSIS — E1142 Type 2 diabetes mellitus with diabetic polyneuropathy: Secondary | ICD-10-CM | POA: Diagnosis not present

## 2017-01-02 DIAGNOSIS — I2510 Atherosclerotic heart disease of native coronary artery without angina pectoris: Secondary | ICD-10-CM | POA: Diagnosis not present

## 2017-01-02 DIAGNOSIS — M5136 Other intervertebral disc degeneration, lumbar region: Secondary | ICD-10-CM | POA: Diagnosis not present

## 2017-01-02 DIAGNOSIS — E1121 Type 2 diabetes mellitus with diabetic nephropathy: Secondary | ICD-10-CM | POA: Diagnosis not present

## 2017-01-02 DIAGNOSIS — M5126 Other intervertebral disc displacement, lumbar region: Secondary | ICD-10-CM | POA: Diagnosis not present

## 2017-01-02 DIAGNOSIS — E1151 Type 2 diabetes mellitus with diabetic peripheral angiopathy without gangrene: Secondary | ICD-10-CM | POA: Diagnosis not present

## 2017-01-03 DIAGNOSIS — I2510 Atherosclerotic heart disease of native coronary artery without angina pectoris: Secondary | ICD-10-CM | POA: Diagnosis not present

## 2017-01-03 DIAGNOSIS — E1142 Type 2 diabetes mellitus with diabetic polyneuropathy: Secondary | ICD-10-CM | POA: Diagnosis not present

## 2017-01-03 DIAGNOSIS — E1121 Type 2 diabetes mellitus with diabetic nephropathy: Secondary | ICD-10-CM | POA: Diagnosis not present

## 2017-01-03 DIAGNOSIS — M5126 Other intervertebral disc displacement, lumbar region: Secondary | ICD-10-CM | POA: Diagnosis not present

## 2017-01-03 DIAGNOSIS — E1151 Type 2 diabetes mellitus with diabetic peripheral angiopathy without gangrene: Secondary | ICD-10-CM | POA: Diagnosis not present

## 2017-01-03 DIAGNOSIS — M5136 Other intervertebral disc degeneration, lumbar region: Secondary | ICD-10-CM | POA: Diagnosis not present

## 2017-01-04 ENCOUNTER — Encounter: Payer: Self-pay | Admitting: Cardiology

## 2017-01-04 ENCOUNTER — Ambulatory Visit (INDEPENDENT_AMBULATORY_CARE_PROVIDER_SITE_OTHER): Payer: Medicare Other | Admitting: Cardiology

## 2017-01-04 ENCOUNTER — Ambulatory Visit (HOSPITAL_BASED_OUTPATIENT_CLINIC_OR_DEPARTMENT_OTHER)
Admission: RE | Admit: 2017-01-04 | Discharge: 2017-01-04 | Disposition: A | Discharge status: Home / Self Care | Payer: Medicare Other | Source: Ambulatory Visit | Attending: Cardiology | Admitting: Cardiology

## 2017-01-04 ENCOUNTER — Other Ambulatory Visit: Payer: Self-pay | Admitting: Cardiology

## 2017-01-04 VITALS — BP 110/68 | HR 109 | Ht 68.0 in | Wt 153.4 lb

## 2017-01-04 DIAGNOSIS — I2510 Atherosclerotic heart disease of native coronary artery without angina pectoris: Secondary | ICD-10-CM | POA: Diagnosis not present

## 2017-01-04 DIAGNOSIS — I10 Essential (primary) hypertension: Secondary | ICD-10-CM

## 2017-01-04 DIAGNOSIS — Z01810 Encounter for preprocedural cardiovascular examination: Secondary | ICD-10-CM | POA: Diagnosis not present

## 2017-01-04 DIAGNOSIS — E1159 Type 2 diabetes mellitus with other circulatory complications: Secondary | ICD-10-CM | POA: Diagnosis not present

## 2017-01-04 DIAGNOSIS — N289 Disorder of kidney and ureter, unspecified: Secondary | ICD-10-CM | POA: Diagnosis not present

## 2017-01-04 DIAGNOSIS — J9811 Atelectasis: Secondary | ICD-10-CM | POA: Diagnosis not present

## 2017-01-04 DIAGNOSIS — R9439 Abnormal result of other cardiovascular function study: Secondary | ICD-10-CM

## 2017-01-04 DIAGNOSIS — Z9229 Personal history of other drug therapy: Secondary | ICD-10-CM | POA: Diagnosis not present

## 2017-01-04 NOTE — Patient Instructions (Signed)
Medication Instructions:  Your physician recommends that you continue on your current medications as directed. Please refer to the Current Medication list given to you today.   Labwork: Your physician recommends that you return for lab work in: Today- for Cath work up   CBC, BMP,PT-INR   Testing/Procedures: A chest x-ray takes a picture of the organs and structures inside the chest, including the heart, lungs, and blood vessels. This test can show several things, including, whether the heart is enlarges; whether fluid is building up in the lungs; and whether pacemaker / defibrillator leads are still in place.   Your provider has recommended a cardiac catherization  You are scheduled for a cardiac catheterization on Monday,July 30th with Dr.Kelly or associate.  [facility] at [address], [place] -  2nd Floor Short Stay on Monday, July 30th  at 8:00am    Special note: Every effort is made to have your procedure done on time.   Please understand that emergencies sometimes delay a scheduled   procedure.  No food or drink after midnight on Sunday.  On the morning of your procedure, take your Plavix. You may take your morning medications with a sip of water on the day of your procedure.  Medications to HOLD -  Metformin  Plan for a one night stay -- bring personal belongings.  Bring a current list of your medications and current insurance cards.  You MUST have a responsible person to drive you home. Someone MUST be with you the first 24 hours after you arrive home or your discharge will be delayed. Wear clothes that are easy to get on and off and wear slip on shoes.    Coronary Angiogram A coronary angiogram, also called coronary angiography, is an X-ray procedure used to look at the arteries in the heart. In this procedure, a dye (contrast dye) is injected through a long, hollow tube (catheter). The catheter is about  the size of a piece of cooked spaghetti and is inserted through your groin, wrist, or arm. The dye is injected into each artery, and X-rays are then taken to show if there is a blockage in the arteries of your heart.  LET [facility] PROVID[facility] KNOW ABOUT:  Any allergies you have, including allergies to shellfish or contrast dye.   All medicines you are taking, including vitamins, herbs, eye drops, creams, and over-the-counter medicines.   Previous problems you or members of your family have had with the use of anesthetics.   Any blood disorders you have.   Previous surgeries you have had.  History of kidney problems or failure.   Other medical conditions you have.  RISKS AND COMPLICATIONS  Generally, a coronary angiogram is a safe procedure. However, about 1 person out of 1000 can have problems that may include:  Allergic reaction to the dye.  Bleeding/bruising from the access site or other locations.  Kidney injury, especially in people with impaired kidney function.  Stroke (rare).  Heart attack (rare).  Irregular rhythms (rare)  Death (rare)  BEFORE THE PROCEDURE   Do not eat or drink anything after midnight the night before the procedure or as directed by your health care provider.   Ask your health care provider about changing or stopping your regular medicines. This is especially important if you are taking diabetes medicines or blood thinners.  PROCEDURE  You may be given a medicine to help you relax (sedative) before the  procedure. This medicine is given through an intravenous (IV) access tube that is inserted into one of your veins.   The area where the catheter will be inserted will be washed and shaved. This is usually done in the groin but may be done in the fold of your arm (near your elbow) or in the wrist.   A medicine will be given to numb the area where the catheter will be inserted (local anesthetic).   The health care provider will  insert the catheter into an artery. The catheter will be guided by using a special type of X-ray (fluoroscopy) of the blood vessel being examined.   A special dye will then be injected into the catheter, and X-rays will be taken. The dye will help to show where any narrowing or blockages are located in the heart arteries.    AFTER THE PROCEDURE   If the procedure is done through the leg, you will be kept in bed lying flat for several hours. You will be instructed to not bend or cross your legs.  The insertion site will be checked frequently.   The pulse in your feet or wrist will be checked frequently.   Additional blood tests, X-rays, and an electrocardiogram may be done.      Follow-Up: Your physician recommends that you schedule a follow-up appointment in: 1 month with Dr. Tomie Chinaevankar   Any Other Special Instructions Will Be Listed Below (If Applicable).     If you need a refill on your cardiac medications before your next appointment, please call your pharmacy.

## 2017-01-04 NOTE — Addendum Note (Signed)
Addended by: Domingo MadeiraMCCLEES, RAVEN N on: 01/04/2017 10:05 AM   Modules accepted: Orders

## 2017-01-04 NOTE — Progress Notes (Signed)
Cardiology Office Note:    Date:  01/04/2017   ID:  [name], DOB 10-11-1934, MRN [id number]  PCP:  [name], Mayes, MD  Cardiologist:  Joel Brothersajan R Revankar, MD   Referring MD: [name], [name], MD    ASSESSMENT:    1. Type 2 diabetes mellitus with vascular disease (HCC)   2. Essential hypertension   3. Coronary artery disease involving native coronary artery of native heart without angina pectoris   4. Preoperative cardiovascular examination   5. Abnormal cardiovascular stress test   6. Renal insufficiency    PLAN:    In order of problems listed above:  1. Patient was initially evaluated by me for preoperative risk stratification for back surgery. The patient has known coronary artery disease, essential hypertension and is a diabetic. She also has renal insufficiency. Now his back for discussion of his abnormal stress test. I discussed these findings with the patient at extensive length. 2. I discussed coronary angiography and left heart catheterization with the patient at extensive length. Procedure, benefits and potential risks were explained. Patient had multiple questions which were answered to the patient's satisfaction. Patient agreed and consented for the procedure. Further recommendations will be made based on the findings of the coronary angiography. In the interim. The patient has any significant symptoms he knows to go to the nearest emergency room. 3. I discussed with the patient about a stress test. This is intermediate risk. I also mentioned to the patient that he has renal insufficiency and that also further symptoms at a greater risk for renal problems and he understands and accepts this risk. We have made the Aware so should they have any renal protocol they can follow this. Also he made not need a left ventriculogram at this time to save him off some of the dye burden. 4. His blood pressure is elevated he appears a little anxious. I also clarified it with him that if his  coronary angiography reveals obstructive disease and he needs intervention in his back surgery. Postponed. He vocalized understanding. 5. Patient has multiple complex issues and complex decision making and the total time for this was 40 minutes. I coordinated with the cardiac Cath Lab about his renal condition and also we will make sure he is appropriately advised about his diabetic medications pertaining to the cardiac cath.   Medication Adjustments/Labs and Tests Ordered: Current medicines are reviewed at length with the patient today.  Concerns regarding medicines are outlined above.  No orders of the defined types were placed in this encounter.  No orders of the defined types were placed in this encounter.    History of Present Illness:    [name] is a 82 y.o. male who is being seen today for the evaluation of Coronary artery disease and preoperative risk stratification. at the request of [name], [name], MD. Patient is here for follow-up of stress test report. This was abnormal and there was significantly large sized scar with peri-infarction ischemia and mildly depressed ejection fraction. Patient essentially lives a sedentary lifestyle because of significant orthopedic issues involving his back. Today he is brought in with a wheelchair. He denies any chest pain orthopnea or PND.  Past Medical History:  Diagnosis Date  . Coronary artery disease   . Diabetes mellitus without complication (HCC)   . Hypertension     Past Surgical History:  Procedure Laterality Date  . CORONARY ARTERY BYPASS GRAFT  1991    Current Medications: Current Meds  Medication Sig  .  amLODipine (NORVASC) 5 MG tablet Take 5 mg by mouth daily.  Marland Kitchen. atorvastatin (LIPITOR) 40 MG tablet Take 40 mg by mouth daily.   . clopidogrel (PLAVIX) 75 MG tablet Take 75 mg by mouth daily.  . cyclobenzaprine (FLEXERIL) 10 MG tablet Take 10 mg by mouth 2 (two) times daily as needed for muscle spasms.  Marland Kitchen. glimepiride  (AMARYL) 2 MG tablet Take 2 mg by mouth daily.  . hydrochlorothiazide (HYDRODIURIL) 12.5 MG tablet Take 12.5 mg by mouth daily.  Marland Kitchen. lisinopril-hydrochlorothiazide (PRINZIDE,ZESTORETIC) 20-12.5 MG tablet Take 1 tablet by mouth 2 (two) times daily.  . metFORMIN (GLUCOPHAGE) 1000 MG tablet Take 1,000 mg by mouth 2 (two) times daily.  . metoprolol tartrate (LOPRESSOR) 50 MG tablet Take 50 mg by mouth 2 (two) times daily.  . nitroGLYCERIN (NITROSTAT) 0.4 MG SL tablet Place 0.4 mg under the tongue every 5 (five) minutes as needed for chest pain.   Marland Kitchen. omega-3 acid ethyl esters (LOVAZA) 1 g capsule Take 1 g by mouth 2 (two) times daily.   Marland Kitchen. oxyCODONE (OXY IR/ROXICODONE) 5 MG immediate release tablet Take 1 tablet (5 mg total) by mouth every 6 (six) hours as needed for severe pain or breakthrough pain.  Marland Kitchen. senna-docusate (SENOKOT-S) 8.6-50 MG tablet Take 1 tablet by mouth at bedtime as needed for mild constipation.  . tamsulosin (FLOMAX) 0.4 MG CAPS capsule Take 0.4 mg by mouth at bedtime.   . traMADol (ULTRAM) 50 MG tablet Take 1 tablet (50 mg total) by mouth 3 (three) times daily as needed for moderate pain.     Allergies:   Patient has no known allergies.   Social History   Social History  . Marital status: Divorced    Spouse name: N/A  . Number of children: N/A  . Years of education: N/A   Social History Main Topics  . Smoking status: Former Smoker    Quit date: 11/27/1986  . Smokeless tobacco: Never Used  . Alcohol use No  . Drug use: No  . Sexual activity: Not Asked   Other Topics Concern  . None   Social History Narrative  . None     Family History: The patient's family history includes Hypertension in his other.  ROS:   Please see the history of present illness.    All other systems reviewed and are negative.  EKGs/Labs/Other Studies Reviewed:    The following studies were reviewed today: I reviewed stress test report with him at extensive length and questions were answered  to his satisfaction.   Recent Labs: 12/25/2016: BUN 31; Creatinine, Ser 1.77; Hemoglobin 10.8; Platelets 173; Potassium 4.3; Sodium 137  Recent Lipid Panel    Component Value Date/Time   CHOL  12/15/2009 0528    117        ATP III CLASSIFICATION:  <200     mg/dL   Desirable  161-096200-239  mg/dL   Borderline High  >=045>=240    mg/dL   High          TRIG 409173 (H) 12/15/2009 0528   HDL 44 12/15/2009 0528   CHOLHDL 2.7 12/15/2009 0528   VLDL 35 12/15/2009 0528   LDLCALC  12/15/2009 0528    38        Total Cholesterol/HDL:CHD Risk Coronary Heart Disease Risk Table                     Men   Women  1/2 Average Risk   3.4   3.3  Average Risk       5.0   4.4  2 X Average Risk   9.6   7.1  3 X Average Risk  23.4   11.0        Use the calculated Patient Ratio above and the CHD Risk Table to determine the patient's CHD Risk.        ATP III CLASSIFICATION (LDL):  <100     mg/dL   Optimal  696-295  mg/dL   Near or Above                    Optimal  130-159  mg/dL   Borderline  284-132  mg/dL   High  >440     mg/dL   Very High    Physical Exam:    VS:  BP 110/68   Pulse (!) 109   Ht 5\' 8"  (1.727 m)   Wt 153 lb 6.4 oz (69.6 kg)   SpO2 98%   BMI 23.32 kg/m     Wt Readings from Last 3 Encounters:  01/04/17 153 lb 6.4 oz (69.6 kg)  01/01/17 155 lb (70.3 kg)  12/28/16 155 lb 6.4 oz (70.5 kg)     GEN: Patient is in no acute distress HEENT: Normal NECK: No JVD; No carotid bruits LYMPHATICS: No lymphadenopathy CARDIAC: S1 S2 regular, 2/6 systolic murmur at the apex. RESPIRATORY:  Clear to auscultation without rales, wheezing or rhonchi  ABDOMEN: Soft, non-tender, non-distended MUSCULOSKELETAL:  No edema; No deformity  SKIN: Warm and dry NEUROLOGIC:  Alert and oriented x 3 PSYCHIATRIC:  Normal affect    Signed, Joel Brothers, MD  01/04/2017 9:34 AM    [facility]

## 2017-01-05 LAB — BASIC METABOLIC PANEL
BUN / CREAT RATIO: 16 (ref 10–24)
BUN: 23 mg/dL (ref 8–27)
CO2: 28 mmol/L (ref 20–29)
CREATININE: 1.48 mg/dL — AB (ref 0.76–1.27)
Calcium: 9.4 mg/dL (ref 8.6–10.2)
Chloride: 98 mmol/L (ref 96–106)
GFR calc Af Amer: 51 mL/min/{1.73_m2} — ABNORMAL LOW (ref >59)
GFR calc non Af Amer: 44 mL/min/{1.73_m2} — ABNORMAL LOW (ref >59)
GLUCOSE: 220 mg/dL — AB (ref 65–99)
Potassium: 4.7 mmol/L (ref 3.5–5.2)
SODIUM: 139 mmol/L (ref 134–144)

## 2017-01-05 LAB — CBC WITH DIFFERENTIAL/PLATELET
Basophils Absolute: 0 10*3/uL (ref 0.0–0.2)
Basos: 1 %
EOS (ABSOLUTE): 0.2 10*3/uL (ref 0.0–0.4)
Eos: 2 %
HEMOGLOBIN: 10.7 g/dL — AB (ref 13.0–17.7)
Hematocrit: 31.8 % — ABNORMAL LOW (ref 37.5–51.0)
Immature Grans (Abs): 0.1 10*3/uL (ref 0.0–0.1)
Immature Granulocytes: 1 %
LYMPHS ABS: 1.5 10*3/uL (ref 0.7–3.1)
Lymphs: 18 %
MCH: 30.7 pg (ref 26.6–33.0)
MCHC: 33.6 g/dL (ref 31.5–35.7)
MCV: 91 fL (ref 79–97)
MONOS ABS: 0.6 10*3/uL (ref 0.1–0.9)
Monocytes: 7 %
NEUTROS ABS: 5.9 10*3/uL (ref 1.4–7.0)
Neutrophils: 71 %
PLATELETS: 201 10*3/uL (ref 150–379)
RBC: 3.49 x10E6/uL — ABNORMAL LOW (ref 4.14–5.80)
RDW: 14.2 % (ref 12.3–15.4)
WBC: 8.2 10*3/uL (ref 3.4–10.8)

## 2017-01-05 LAB — PROTIME-INR
INR: 1 (ref 0.8–1.2)
Prothrombin Time: 10.2 s (ref 9.1–12.0)

## 2017-01-07 ENCOUNTER — Ambulatory Visit (HOSPITAL_COMMUNITY)
Admission: RE | Admit: 2017-01-07 | Discharge: 2017-01-07 | Disposition: A | Discharge status: Home / Self Care | Payer: Medicare Other | Source: Ambulatory Visit | Attending: Cardiovascular Disease | Admitting: Cardiovascular Disease

## 2017-01-07 ENCOUNTER — Encounter (HOSPITAL_COMMUNITY)
Admission: RE | Disposition: A | Discharge status: Home / Self Care | Payer: Self-pay | Source: Ambulatory Visit | Attending: Cardiovascular Disease

## 2017-01-07 DIAGNOSIS — I10 Essential (primary) hypertension: Secondary | ICD-10-CM | POA: Diagnosis not present

## 2017-01-07 DIAGNOSIS — I25810 Atherosclerosis of coronary artery bypass graft(s) without angina pectoris: Secondary | ICD-10-CM | POA: Diagnosis not present

## 2017-01-07 DIAGNOSIS — I2582 Chronic total occlusion of coronary artery: Secondary | ICD-10-CM | POA: Insufficient documentation

## 2017-01-07 DIAGNOSIS — Z7902 Long term (current) use of antithrombotics/antiplatelets: Secondary | ICD-10-CM | POA: Diagnosis not present

## 2017-01-07 DIAGNOSIS — Z7984 Long term (current) use of oral hypoglycemic drugs: Secondary | ICD-10-CM | POA: Diagnosis not present

## 2017-01-07 DIAGNOSIS — Z87891 Personal history of nicotine dependence: Secondary | ICD-10-CM | POA: Insufficient documentation

## 2017-01-07 DIAGNOSIS — I25709 Atherosclerosis of coronary artery bypass graft(s), unspecified, with unspecified angina pectoris: Secondary | ICD-10-CM

## 2017-01-07 DIAGNOSIS — E1159 Type 2 diabetes mellitus with other circulatory complications: Secondary | ICD-10-CM | POA: Insufficient documentation

## 2017-01-07 DIAGNOSIS — I2510 Atherosclerotic heart disease of native coronary artery without angina pectoris: Secondary | ICD-10-CM | POA: Diagnosis present

## 2017-01-07 DIAGNOSIS — N289 Disorder of kidney and ureter, unspecified: Secondary | ICD-10-CM | POA: Diagnosis not present

## 2017-01-07 HISTORY — PX: LEFT HEART CATH AND CORS/GRAFTS ANGIOGRAPHY: CATH118250

## 2017-01-07 LAB — CBC
HEMATOCRIT: 32.6 % — AB (ref 39.0–52.0)
Hemoglobin: 10.7 g/dL — ABNORMAL LOW (ref 13.0–17.0)
MCH: 29.8 pg (ref 26.0–34.0)
MCHC: 32.8 g/dL (ref 30.0–36.0)
MCV: 90.8 fL (ref 78.0–100.0)
Platelets: 203 10*3/uL (ref 150–400)
RBC: 3.59 MIL/uL — ABNORMAL LOW (ref 4.22–5.81)
RDW: 13.2 % (ref 11.5–15.5)
WBC: 7 10*3/uL (ref 4.0–10.5)

## 2017-01-07 LAB — PROTIME-INR
INR: 1.02
Prothrombin Time: 13.4 seconds (ref 11.4–15.2)

## 2017-01-07 LAB — GLUCOSE, CAPILLARY: Glucose-Capillary: 202 mg/dL — ABNORMAL HIGH (ref 65–99)

## 2017-01-07 LAB — BASIC METABOLIC PANEL
Anion gap: 9 (ref 5–15)
BUN: 26 mg/dL — ABNORMAL HIGH (ref 6–20)
CO2: 27 mmol/L (ref 22–32)
Calcium: 9.5 mg/dL (ref 8.9–10.3)
Chloride: 98 mmol/L — ABNORMAL LOW (ref 101–111)
Creatinine, Ser: 1.49 mg/dL — ABNORMAL HIGH (ref 0.61–1.24)
GFR calc Af Amer: 49 mL/min — ABNORMAL LOW (ref >60)
GFR calc non Af Amer: 43 mL/min — ABNORMAL LOW (ref >60)
Glucose, Bld: 250 mg/dL — ABNORMAL HIGH (ref 65–99)
Potassium: 4.2 mmol/L (ref 3.5–5.1)
Sodium: 134 mmol/L — ABNORMAL LOW (ref 135–145)

## 2017-01-07 SURGERY — LEFT HEART CATH AND CORS/GRAFTS ANGIOGRAPHY
Anesthesia: LOCAL

## 2017-01-07 MED ORDER — FENTANYL CITRATE (PF) 100 MCG/2ML IJ SOLN
INTRAMUSCULAR | Status: AC
  Filled 2017-01-07: qty 2

## 2017-01-07 MED ORDER — FENTANYL CITRATE (PF) 100 MCG/2ML IJ SOLN
INTRAMUSCULAR | Status: DC | PRN
Start: 2017-01-07 — End: 2017-01-07
  Administered 2017-01-07: 50 ug via INTRAVENOUS

## 2017-01-07 MED ORDER — SODIUM CHLORIDE 0.9 % IV SOLN: 250.0000 mL | INTRAVENOUS | Status: DC | PRN

## 2017-01-07 MED ORDER — ONDANSETRON HCL 4 MG/2ML IJ SOLN: 4.0000 mg | Freq: Four times a day (QID) | INTRAMUSCULAR | Status: DC | PRN

## 2017-01-07 MED ORDER — LIDOCAINE HCL (PF) 1 % IJ SOLN
INTRAMUSCULAR | Status: AC
  Filled 2017-01-07: qty 30

## 2017-01-07 MED ORDER — LIDOCAINE HCL (PF) 1 % IJ SOLN
INTRAMUSCULAR | Status: DC | PRN
  Administered 2017-01-07: 17 mL

## 2017-01-07 MED ORDER — SODIUM CHLORIDE 0.9% FLUSH: 3.0000 mL | Freq: Two times a day (BID) | INTRAVENOUS | Status: DC

## 2017-01-07 MED ORDER — IOPAMIDOL (ISOVUE-370) INJECTION 76%
INTRAVENOUS | Status: AC
  Filled 2017-01-07: qty 50

## 2017-01-07 MED ORDER — ACETAMINOPHEN 325 MG PO TABS: 650.0000 mg | ORAL_TABLET | ORAL | Status: DC | PRN

## 2017-01-07 MED ORDER — HEPARIN (PORCINE) IN NACL 2-0.9 UNIT/ML-% IJ SOLN
INTRAMUSCULAR | Status: AC | PRN
  Administered 2017-01-07: 1000 mL

## 2017-01-07 MED ORDER — ASPIRIN 81 MG PO CHEW
CHEWABLE_TABLET | ORAL | Status: AC
  Administered 2017-01-07: 81 mg
  Filled 2017-01-07: qty 1

## 2017-01-07 MED ORDER — ASPIRIN 81 MG PO CHEW: 81.0000 mg | CHEWABLE_TABLET | ORAL | Status: DC

## 2017-01-07 MED ORDER — MIDAZOLAM HCL 2 MG/2ML IJ SOLN
INTRAMUSCULAR | Status: AC
  Filled 2017-01-07: qty 2

## 2017-01-07 MED ORDER — HEPARIN (PORCINE) IN NACL 2-0.9 UNIT/ML-% IJ SOLN
INTRAMUSCULAR | Status: AC
  Filled 2017-01-07: qty 1000

## 2017-01-07 MED ORDER — SODIUM CHLORIDE 0.9% FLUSH: 3.0000 mL | INTRAVENOUS | Status: DC | PRN

## 2017-01-07 MED ORDER — IOPAMIDOL (ISOVUE-370) INJECTION 76%
INTRAVENOUS | Status: AC
  Filled 2017-01-07: qty 100

## 2017-01-07 MED ORDER — MIDAZOLAM HCL 2 MG/2ML IJ SOLN
INTRAMUSCULAR | Status: DC | PRN
  Administered 2017-01-07: 2 mg via INTRAVENOUS

## 2017-01-07 MED ORDER — IOPAMIDOL (ISOVUE-370) INJECTION 76%
INTRAVENOUS | Status: DC | PRN
  Administered 2017-01-07: 140 mL via INTRAVENOUS

## 2017-01-07 MED ORDER — SODIUM CHLORIDE 0.9 % IV SOLN: INTRAVENOUS | Status: DC

## 2017-01-07 MED ORDER — SODIUM CHLORIDE 0.9 % WEIGHT BASED INFUSION
3.0000 mL/kg/h | INTRAVENOUS | Status: AC
  Administered 2017-01-07: 3 mL/kg/h via INTRAVENOUS

## 2017-01-07 MED ORDER — SODIUM CHLORIDE 0.9 % WEIGHT BASED INFUSION: 1.0000 mL/kg/h | INTRAVENOUS | Status: DC

## 2017-01-07 SURGICAL SUPPLY — 10 items
CATH INFINITI 5 FR AR2 MOD (CATHETERS) ×2 IMPLANT
CATH INFINITI 5 FR RCB (CATHETERS) ×2 IMPLANT
CATH INFINITI 5FR MULTPACK ANG (CATHETERS) ×2 IMPLANT
KIT HEART LEFT (KITS) ×2 IMPLANT
PACK CARDIAC CATHETERIZATION (CUSTOM PROCEDURE TRAY) ×2 IMPLANT
SHEATH PINNACLE 5F 10CM (SHEATH) ×2 IMPLANT
SYR MEDRAD MARK V 150ML (SYRINGE) ×2 IMPLANT
TRANSDUCER W/STOPCOCK (MISCELLANEOUS) ×2 IMPLANT
TUBING CIL FLEX 10 FLL-RA (TUBING) ×2 IMPLANT
WIRE EMERALD 3MM-J .035X150CM (WIRE) ×2 IMPLANT

## 2017-01-07 NOTE — Progress Notes (Signed)
Client has scabbed over sores on scalp and states "it might be shingles" and states has never had shingles before; PA to see per Sheryle SprayJennifer O'Neal,RN and client placed on resp isolation

## 2017-01-07 NOTE — Interval H&P Note (Signed)
Cath Lab Visit (complete for each Cath Lab visit)  Clinical Evaluation Leading to the Procedure:   ACS: No.  Non-ACS:    Anginal Classification: CCS II  Anti-ischemic medical therapy: Minimal Therapy (1 class of medications)  Non-Invasive Test Results: Intermediate-risk stress test findings: cardiac mortality 1-3%/year  Prior CABG: Previous CABG      History and Physical Interval Note:  01/07/2017 12:44 PM  [name]  has presented today for surgery, with the diagnosis of abnormal stress test, cad  The various methods of treatment have been discussed with the patient and family. After consideration of risks, benefits and other options for treatment, the patient has consented to  Procedure(s): Left Heart Cath and Cors/Grafts Angiography (N/A) as a surgical intervention .  The patient's history has been reviewed, patient examined, no change in status, stable for surgery.  I have reviewed the patient's chart and labs.  Questions were answered to the patient's satisfaction.     Nicki Guadalajarahomas Kelly

## 2017-01-07 NOTE — Discharge Instructions (Signed)

## 2017-01-07 NOTE — Progress Notes (Addendum)
Site area: RFA Site Prior to Removal:  Level 0 Pressure Applied For: 25 min Manual: yes   Patient Status During Pull:  stable Post Pull Site:  Level 0 Post Pull Instructions Given: yes  Post Pull Pulses Present: palpable Dressing Applied:  tegaderm Bedrest begins @ 1435 till 1835 Comments:

## 2017-01-07 NOTE — H&P (View-Only) (Signed)
Cardiology Office Note:    Date:  01/04/2017   ID:  Joel MapleWilliam F Hedberg, DOB 10-11-1934, MRN [id number]  PCP:  Martha ClanShaw, Mayes, MD  Cardiologist:  Garwin Brothersajan R Revankar, MD   Referring MD: Martha ClanShaw, [name], MD    ASSESSMENT:    1. Type 2 diabetes mellitus with vascular disease (HCC)   2. Essential hypertension   3. Coronary artery disease involving native coronary artery of native heart without angina pectoris   4. Preoperative cardiovascular examination   5. Abnormal cardiovascular stress test   6. Renal insufficiency    PLAN:    In order of problems listed above:  1. Patient was initially evaluated by me for preoperative risk stratification for back surgery. The patient has known coronary artery disease, essential hypertension and is a diabetic. She also has renal insufficiency. Now his back for discussion of his abnormal stress test. I discussed these findings with the patient at extensive length. 2. I discussed coronary angiography and left heart catheterization with the patient at extensive length. Procedure, benefits and potential risks were explained. Patient had multiple questions which were answered to the patient's satisfaction. Patient agreed and consented for the procedure. Further recommendations will be made based on the findings of the coronary angiography. In the interim. The patient has any significant symptoms he knows to go to the nearest emergency room. 3. I discussed with the patient about a stress test. This is intermediate risk. I also mentioned to the patient that he has renal insufficiency and that also further symptoms at a greater risk for renal problems and he understands and accepts this risk. We have made the Aware so should they have any renal protocol they can follow this. Also he made not need a left ventriculogram at this time to save him off some of the dye burden. 4. His blood pressure is elevated he appears a little anxious. I also clarified it with him that if his  coronary angiography reveals obstructive disease and he needs intervention in his back surgery. Postponed. He vocalized understanding. 5. Patient has multiple complex issues and complex decision making and the total time for this was 40 minutes. I coordinated with the cardiac Cath Lab about his renal condition and also we will make sure he is appropriately advised about his diabetic medications pertaining to the cardiac cath.   Medication Adjustments/Labs and Tests Ordered: Current medicines are reviewed at length with the patient today.  Concerns regarding medicines are outlined above.  No orders of the defined types were placed in this encounter.  No orders of the defined types were placed in this encounter.    History of Present Illness:    [name] is a 82 y.o. male who is being seen today for the evaluation of Coronary artery disease and preoperative risk stratification. at the request of Martha ClanShaw, [name], MD. Patient is here for follow-up of stress test report. This was abnormal and there was significantly large sized scar with peri-infarction ischemia and mildly depressed ejection fraction. Patient essentially lives a sedentary lifestyle because of significant orthopedic issues involving his back. Today he is brought in with a wheelchair. He denies any chest pain orthopnea or PND.  Past Medical History:  Diagnosis Date  . Coronary artery disease   . Diabetes mellitus without complication (HCC)   . Hypertension     Past Surgical History:  Procedure Laterality Date  . CORONARY ARTERY BYPASS GRAFT  1991    Current Medications: Current Meds  Medication Sig  .  amLODipine (NORVASC) 5 MG tablet Take 5 mg by mouth daily.  Marland Kitchen. atorvastatin (LIPITOR) 40 MG tablet Take 40 mg by mouth daily.   . clopidogrel (PLAVIX) 75 MG tablet Take 75 mg by mouth daily.  . cyclobenzaprine (FLEXERIL) 10 MG tablet Take 10 mg by mouth 2 (two) times daily as needed for muscle spasms.  Marland Kitchen. glimepiride  (AMARYL) 2 MG tablet Take 2 mg by mouth daily.  . hydrochlorothiazide (HYDRODIURIL) 12.5 MG tablet Take 12.5 mg by mouth daily.  Marland Kitchen. lisinopril-hydrochlorothiazide (PRINZIDE,ZESTORETIC) 20-12.5 MG tablet Take 1 tablet by mouth 2 (two) times daily.  . metFORMIN (GLUCOPHAGE) 1000 MG tablet Take 1,000 mg by mouth 2 (two) times daily.  . metoprolol tartrate (LOPRESSOR) 50 MG tablet Take 50 mg by mouth 2 (two) times daily.  . nitroGLYCERIN (NITROSTAT) 0.4 MG SL tablet Place 0.4 mg under the tongue every 5 (five) minutes as needed for chest pain.   Marland Kitchen. omega-3 acid ethyl esters (LOVAZA) 1 g capsule Take 1 g by mouth 2 (two) times daily.   Marland Kitchen. oxyCODONE (OXY IR/ROXICODONE) 5 MG immediate release tablet Take 1 tablet (5 mg total) by mouth every 6 (six) hours as needed for severe pain or breakthrough pain.  Marland Kitchen. senna-docusate (SENOKOT-S) 8.6-50 MG tablet Take 1 tablet by mouth at bedtime as needed for mild constipation.  . tamsulosin (FLOMAX) 0.4 MG CAPS capsule Take 0.4 mg by mouth at bedtime.   . traMADol (ULTRAM) 50 MG tablet Take 1 tablet (50 mg total) by mouth 3 (three) times daily as needed for moderate pain.     Allergies:   Patient has no known allergies.   Social History   Social History  . Marital status: Divorced    Spouse name: N/A  . Number of children: N/A  . Years of education: N/A   Social History Main Topics  . Smoking status: Former Smoker    Quit date: 11/27/1986  . Smokeless tobacco: Never Used  . Alcohol use No  . Drug use: No  . Sexual activity: Not Asked   Other Topics Concern  . None   Social History Narrative  . None     Family History: The patient's family history includes Hypertension in his other.  ROS:   Please see the history of present illness.    All other systems reviewed and are negative.  EKGs/Labs/Other Studies Reviewed:    The following studies were reviewed today: I reviewed stress test report with him at extensive length and questions were answered  to his satisfaction.   Recent Labs: 12/25/2016: BUN 31; Creatinine, Ser 1.77; Hemoglobin 10.8; Platelets 173; Potassium 4.3; Sodium 137  Recent Lipid Panel    Component Value Date/Time   CHOL  12/15/2009 0528    117        ATP III CLASSIFICATION:  <200     mg/dL   Desirable  161-096200-239  mg/dL   Borderline High  >=045>=240    mg/dL   High          TRIG 409173 (H) 12/15/2009 0528   HDL 44 12/15/2009 0528   CHOLHDL 2.7 12/15/2009 0528   VLDL 35 12/15/2009 0528   LDLCALC  12/15/2009 0528    38        Total Cholesterol/HDL:CHD Risk Coronary Heart Disease Risk Table                     Men   Women  1/2 Average Risk   3.4   3.3  Average Risk       5.0   4.4  2 X Average Risk   9.6   7.1  3 X Average Risk  23.4   11.0        Use the calculated Patient Ratio above and the CHD Risk Table to determine the patient's CHD Risk.        ATP III CLASSIFICATION (LDL):  <100     mg/dL   Optimal  696-295  mg/dL   Near or Above                    Optimal  130-159  mg/dL   Borderline  284-132  mg/dL   High  >440     mg/dL   Very High    Physical Exam:    VS:  BP 110/68   Pulse (!) 109   Ht 5\' 8"  (1.727 m)   Wt 153 lb 6.4 oz (69.6 kg)   SpO2 98%   BMI 23.32 kg/m     Wt Readings from Last 3 Encounters:  01/04/17 153 lb 6.4 oz (69.6 kg)  01/01/17 155 lb (70.3 kg)  12/28/16 155 lb 6.4 oz (70.5 kg)     GEN: Patient is in no acute distress HEENT: Normal NECK: No JVD; No carotid bruits LYMPHATICS: No lymphadenopathy CARDIAC: S1 S2 regular, 2/6 systolic murmur at the apex. RESPIRATORY:  Clear to auscultation without rales, wheezing or rhonchi  ABDOMEN: Soft, non-tender, non-distended MUSCULOSKELETAL:  No edema; No deformity  SKIN: Warm and dry NEUROLOGIC:  Alert and oriented x 3 PSYCHIATRIC:  Normal affect    Signed, Garwin Brothers, MD  01/04/2017 9:34 AM    [facility]

## 2017-01-08 ENCOUNTER — Encounter (HOSPITAL_COMMUNITY): Payer: Self-pay | Admitting: Cardiovascular Disease

## 2017-01-08 DIAGNOSIS — E1121 Type 2 diabetes mellitus with diabetic nephropathy: Secondary | ICD-10-CM | POA: Diagnosis not present

## 2017-01-08 DIAGNOSIS — M5126 Other intervertebral disc displacement, lumbar region: Secondary | ICD-10-CM | POA: Diagnosis not present

## 2017-01-08 DIAGNOSIS — E1142 Type 2 diabetes mellitus with diabetic polyneuropathy: Secondary | ICD-10-CM | POA: Diagnosis not present

## 2017-01-08 DIAGNOSIS — E1151 Type 2 diabetes mellitus with diabetic peripheral angiopathy without gangrene: Secondary | ICD-10-CM | POA: Diagnosis not present

## 2017-01-08 DIAGNOSIS — M5136 Other intervertebral disc degeneration, lumbar region: Secondary | ICD-10-CM | POA: Diagnosis not present

## 2017-01-08 DIAGNOSIS — I2510 Atherosclerotic heart disease of native coronary artery without angina pectoris: Secondary | ICD-10-CM | POA: Diagnosis not present

## 2017-01-09 ENCOUNTER — Encounter (HOSPITAL_COMMUNITY): Admission: RE | Payer: Self-pay | Source: Ambulatory Visit

## 2017-01-09 ENCOUNTER — Ambulatory Visit (HOSPITAL_COMMUNITY): Admission: RE | Admit: 2017-01-09 | Payer: Medicare Other | Source: Ambulatory Visit | Admitting: Neurosurgery

## 2017-01-09 SURGERY — LUMBAR LAMINECTOMY/DECOMPRESSION MICRODISCECTOMY 1 LEVEL
Anesthesia: General | Laterality: Left

## 2017-01-28 ENCOUNTER — Telehealth: Payer: Self-pay | Admitting: Cardiology

## 2017-01-28 NOTE — Telephone Encounter (Signed)
Message routed to Dr. Revankar for review.  

## 2017-01-28 NOTE — Telephone Encounter (Signed)
New message    Nikki from Dr. Lovell Sheehan office is calling. She said pt had a cath a week ago and now wants to reschedule their procedure. She wants to know when pt is clear to reschedule. Please call.

## 2017-02-08 ENCOUNTER — Ambulatory Visit: Payer: Medicare Other | Admitting: Cardiology

## 2017-02-09 DEATH — deceased

## 2017-03-05 NOTE — Telephone Encounter (Signed)
Do you have a open encounter on this pt??

## 2018-07-19 IMAGING — NM NM MISC PROCEDURE
6 series · 36 of 36 positions shown · non-contrast
Comparison: none

[Series 1: stress-sum-em · 6.40mm/px · 6 of 64 frames shown]
[frame 6/64]
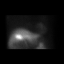
[frame 16/64]
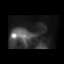
[frame 27/64]
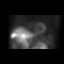
[frame 38/64]
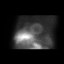
[frame 48/64]
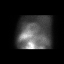
[frame 59/64]
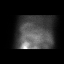

[Series 1: wbr_s-proj_st stress-sum-em · 6.40mm/px · 6 of 64 frames shown]
[frame 6/64]
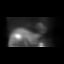
[frame 16/64]
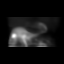
[frame 27/64]
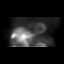
[frame 38/64]
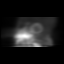
[frame 48/64]
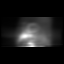
[frame 59/64]
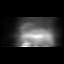

[Series 1: stress-gsp · 6.40mm/px · 6 of 512 frames shown]
[frame 43/512]
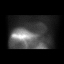
[frame 128/512]
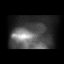
[frame 214/512]
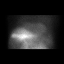
[frame 299/512]
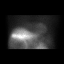
[frame 384/512]
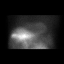
[frame 470/512]
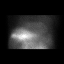

[Series 1: wbr_r-proj_st rest · 6.40mm/px · 6 of 64 frames shown]
[frame 6/64]
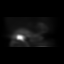
[frame 16/64]
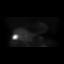
[frame 27/64]
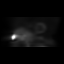
[frame 38/64]
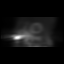
[frame 48/64]
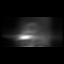
[frame 59/64]
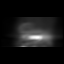

[Series 1: wbr_s-proj_st stress-gsp · 6.40mm/px · 6 of 512 frames shown]
[frame 43/512]
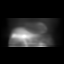
[frame 128/512]
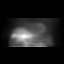
[frame 214/512]
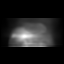
[frame 299/512]
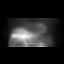
[frame 384/512]
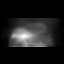
[frame 470/512]
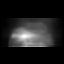

[Series 1: rest · 6.40mm/px · 6 of 64 frames shown]
[frame 6/64]
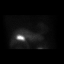
[frame 16/64]
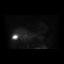
[frame 27/64]
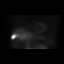
[frame 38/64]
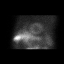
[frame 48/64]
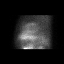
[frame 59/64]
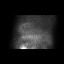

[36 of 36 positions shown; findings below may reference images not displayed]

Canned report from images found in remote index.

Refer to host system for actual result text.
